# Patient Record
Sex: Female | Born: 1970 | Race: Black or African American | Hispanic: No
Health system: Southern US, Community
[De-identification: ages and names within clinical notes are randomized; demographics above are authoritative.]

## PROBLEM LIST (undated history)

## (undated) DIAGNOSIS — Z860100 Personal history of colon polyps, unspecified: Secondary | ICD-10-CM

## (undated) DIAGNOSIS — N760 Acute vaginitis: Secondary | ICD-10-CM

## (undated) DIAGNOSIS — J309 Allergic rhinitis, unspecified: Secondary | ICD-10-CM

## (undated) DIAGNOSIS — N809 Endometriosis, unspecified: Secondary | ICD-10-CM

## (undated) DIAGNOSIS — B9689 Other specified bacterial agents as the cause of diseases classified elsewhere: Secondary | ICD-10-CM

## (undated) DIAGNOSIS — I10 Essential (primary) hypertension: Secondary | ICD-10-CM

## (undated) DIAGNOSIS — M1990 Unspecified osteoarthritis, unspecified site: Secondary | ICD-10-CM

## (undated) DIAGNOSIS — K219 Gastro-esophageal reflux disease without esophagitis: Secondary | ICD-10-CM

## (undated) HISTORY — DX: Acute vaginitis: N76.0

## (undated) HISTORY — PX: EYE SURGERY: SHX253

## (undated) HISTORY — PX: CRYOTHERAPY: SHX1416

## (undated) HISTORY — DX: Endometriosis, unspecified: N80.9

## (undated) HISTORY — DX: Personal history of colon polyps, unspecified: Z86.0100

## (undated) HISTORY — PX: PELVIC LAPAROSCOPY: SHX162

## (undated) HISTORY — DX: Other specified bacterial agents as the cause of diseases classified elsewhere: B96.89

## (undated) HISTORY — DX: Personal history of colonic polyps: Z86.010

## (undated) HISTORY — DX: Allergic rhinitis, unspecified: J30.9

## (undated) HISTORY — DX: Unspecified osteoarthritis, unspecified site: M19.90

## (undated) HISTORY — DX: Gastro-esophageal reflux disease without esophagitis: K21.9

---

## 1998-09-09 ENCOUNTER — Other Ambulatory Visit: Admission: RE | Admit: 1998-09-09 | Discharge: 1998-09-09 | Payer: Self-pay | Admitting: Obstetrics and Gynecology

## 1999-04-08 ENCOUNTER — Other Ambulatory Visit: Admission: RE | Admit: 1999-04-08 | Discharge: 1999-04-08 | Payer: Self-pay | Admitting: *Deleted

## 1999-08-01 ENCOUNTER — Ambulatory Visit (HOSPITAL_COMMUNITY): Admission: RE | Admit: 1999-08-01 | Discharge: 1999-08-01 | Payer: Self-pay | Admitting: Obstetrics and Gynecology

## 1999-08-01 ENCOUNTER — Encounter: Payer: Self-pay | Admitting: Obstetrics and Gynecology

## 1999-11-02 ENCOUNTER — Inpatient Hospital Stay (HOSPITAL_COMMUNITY): Admission: AD | Admit: 1999-11-02 | Discharge: 1999-11-05 | Payer: Self-pay | Admitting: Obstetrics and Gynecology

## 2000-04-16 ENCOUNTER — Other Ambulatory Visit: Admission: RE | Admit: 2000-04-16 | Discharge: 2000-04-16 | Payer: Self-pay | Admitting: Obstetrics and Gynecology

## 2001-05-19 ENCOUNTER — Emergency Department (HOSPITAL_COMMUNITY): Admission: EM | Admit: 2001-05-19 | Discharge: 2001-05-19 | Payer: Self-pay | Admitting: Emergency Medicine

## 2001-05-19 ENCOUNTER — Encounter: Payer: Self-pay | Admitting: Emergency Medicine

## 2001-06-05 ENCOUNTER — Encounter: Admission: RE | Admit: 2001-06-05 | Discharge: 2001-06-20 | Payer: Self-pay | Admitting: Family Medicine

## 2002-03-27 ENCOUNTER — Ambulatory Visit (HOSPITAL_COMMUNITY): Admission: RE | Admit: 2002-03-27 | Discharge: 2002-03-27 | Payer: Self-pay | Admitting: Gastroenterology

## 2002-03-27 ENCOUNTER — Encounter: Payer: Self-pay | Admitting: Gastroenterology

## 2002-05-08 ENCOUNTER — Encounter: Payer: Self-pay | Admitting: Emergency Medicine

## 2002-05-08 ENCOUNTER — Emergency Department (HOSPITAL_COMMUNITY): Admission: EM | Admit: 2002-05-08 | Discharge: 2002-05-08 | Payer: Self-pay | Admitting: Emergency Medicine

## 2002-06-10 ENCOUNTER — Other Ambulatory Visit: Admission: RE | Admit: 2002-06-10 | Discharge: 2002-06-10 | Payer: Self-pay | Admitting: Obstetrics and Gynecology

## 2002-09-12 ENCOUNTER — Inpatient Hospital Stay (HOSPITAL_COMMUNITY): Admission: RE | Admit: 2002-09-12 | Discharge: 2002-09-14 | Payer: Self-pay | Admitting: Obstetrics and Gynecology

## 2002-09-12 ENCOUNTER — Encounter (INDEPENDENT_AMBULATORY_CARE_PROVIDER_SITE_OTHER): Payer: Self-pay

## 2003-06-02 ENCOUNTER — Other Ambulatory Visit: Admission: RE | Admit: 2003-06-02 | Discharge: 2003-06-02 | Payer: Self-pay | Admitting: Obstetrics and Gynecology

## 2004-03-06 ENCOUNTER — Emergency Department (HOSPITAL_COMMUNITY): Admission: EM | Admit: 2004-03-06 | Discharge: 2004-03-06 | Payer: Self-pay | Admitting: Emergency Medicine

## 2004-05-17 ENCOUNTER — Ambulatory Visit (HOSPITAL_COMMUNITY): Admission: RE | Admit: 2004-05-17 | Discharge: 2004-05-17 | Payer: Self-pay | Admitting: Obstetrics and Gynecology

## 2004-07-01 ENCOUNTER — Other Ambulatory Visit: Admission: RE | Admit: 2004-07-01 | Discharge: 2004-07-01 | Payer: Self-pay | Admitting: Obstetrics and Gynecology

## 2004-09-09 ENCOUNTER — Ambulatory Visit: Payer: Self-pay | Admitting: Family Medicine

## 2005-07-06 ENCOUNTER — Other Ambulatory Visit: Admission: RE | Admit: 2005-07-06 | Discharge: 2005-07-06 | Payer: Self-pay | Admitting: Obstetrics and Gynecology

## 2005-08-10 ENCOUNTER — Encounter: Admission: RE | Admit: 2005-08-10 | Discharge: 2005-08-10 | Payer: Self-pay | Admitting: Obstetrics and Gynecology

## 2005-08-26 ENCOUNTER — Encounter: Admission: RE | Admit: 2005-08-26 | Discharge: 2005-08-26 | Payer: Self-pay | Admitting: Obstetrics and Gynecology

## 2006-05-29 ENCOUNTER — Ambulatory Visit: Payer: Self-pay | Admitting: Family Medicine

## 2006-07-18 ENCOUNTER — Other Ambulatory Visit: Admission: RE | Admit: 2006-07-18 | Discharge: 2006-07-18 | Payer: Self-pay | Admitting: Obstetrics and Gynecology

## 2007-01-14 ENCOUNTER — Ambulatory Visit: Payer: Self-pay | Admitting: Internal Medicine

## 2007-01-15 ENCOUNTER — Encounter: Payer: Self-pay | Admitting: Internal Medicine

## 2007-01-19 ENCOUNTER — Ambulatory Visit: Payer: Self-pay | Admitting: Family Medicine

## 2007-07-31 LAB — HM COLONOSCOPY

## 2007-08-20 ENCOUNTER — Ambulatory Visit: Payer: Self-pay | Admitting: Internal Medicine

## 2007-08-20 DIAGNOSIS — J309 Allergic rhinitis, unspecified: Secondary | ICD-10-CM | POA: Insufficient documentation

## 2007-08-20 DIAGNOSIS — Z860100 Personal history of colon polyps, unspecified: Secondary | ICD-10-CM | POA: Insufficient documentation

## 2007-08-20 DIAGNOSIS — N808 Other endometriosis: Secondary | ICD-10-CM | POA: Insufficient documentation

## 2007-08-20 DIAGNOSIS — K219 Gastro-esophageal reflux disease without esophagitis: Secondary | ICD-10-CM | POA: Insufficient documentation

## 2007-08-20 LAB — CONVERTED CEMR LAB: Glucose, Bld: 89 mg/dL

## 2007-09-05 ENCOUNTER — Ambulatory Visit: Payer: Self-pay | Admitting: Gastroenterology

## 2007-09-18 ENCOUNTER — Ambulatory Visit: Payer: Self-pay | Admitting: Gastroenterology

## 2007-09-24 ENCOUNTER — Encounter: Payer: Self-pay | Admitting: Internal Medicine

## 2007-11-05 ENCOUNTER — Ambulatory Visit: Payer: Self-pay | Admitting: Internal Medicine

## 2008-02-29 ENCOUNTER — Ambulatory Visit: Payer: Self-pay | Admitting: Internal Medicine

## 2008-02-29 LAB — CONVERTED CEMR LAB: Rapid Strep: POSITIVE

## 2008-08-10 ENCOUNTER — Telehealth (INDEPENDENT_AMBULATORY_CARE_PROVIDER_SITE_OTHER): Payer: Self-pay | Admitting: *Deleted

## 2008-09-22 ENCOUNTER — Ambulatory Visit: Payer: Self-pay | Admitting: Internal Medicine

## 2008-12-15 ENCOUNTER — Ambulatory Visit: Payer: Self-pay | Admitting: Internal Medicine

## 2009-08-11 ENCOUNTER — Telehealth (INDEPENDENT_AMBULATORY_CARE_PROVIDER_SITE_OTHER): Payer: Self-pay | Admitting: *Deleted

## 2009-11-11 ENCOUNTER — Ambulatory Visit: Payer: Self-pay | Admitting: Internal Medicine

## 2010-08-16 ENCOUNTER — Ambulatory Visit: Payer: Self-pay | Admitting: Internal Medicine

## 2010-08-16 DIAGNOSIS — S5000XA Contusion of unspecified elbow, initial encounter: Secondary | ICD-10-CM | POA: Insufficient documentation

## 2010-08-16 DIAGNOSIS — M542 Cervicalgia: Secondary | ICD-10-CM | POA: Insufficient documentation

## 2010-11-20 ENCOUNTER — Encounter: Payer: Self-pay | Admitting: Obstetrics and Gynecology

## 2010-12-01 NOTE — Assessment & Plan Note (Signed)
Summary: rov/swh   Vital Signs:  Patient profile:   40 year old female Height:      64 inches Weight:      153.13 pounds BMI:     26.38 Pulse rate:   70 / minute BP sitting:   140 / 80  Vitals Entered By: Kandice Hams (November 11, 2009 4:34 PM) CC: refill on meds   History of Present Illness: RF protonix  GERD symptoms agravated temporarily by a URI but she is back to normal now she noted  that her GERD  symptom is mostly cough rather than classic heartburn  Allergies: No Known Drug Allergies  Past History:  Past Medical History: Reviewed history from 09/22/2008 and no changes required. G1 P1 Colonic polyps, hx of GERD Allergic rhinitis Cscope 11-08 tics, repeat in 10 years patient would like to a cscope sooner, will discuss on each CPX)  Past Surgical History: Reviewed history from 11/05/2007 and no changes required. Surgery for endometriosis (2003) Eye surgery PRK Bilat  Family History: breast ca--no DM--gm? HTN-- F and M colon ca? GM  (Dx age 23s to 48s)  Social History: Reviewed history from 08/20/2007 and no changes required. 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 GI:  Denies bloody stools, nausea, and vomiting; no dysphagia .  Physical Exam  General:  alert, well-developed, and well-nourished.   Lungs:  normal respiratory effort, no intercostal retractions, no accessory muscle use, and normal breath sounds.   Heart:  normal rate, regular rhythm, and no murmur.   Abdomen:  soft, non-tender, and no distention.     Impression & Recommendations:  Problem # 1:  GERD (ICD-530.81) RF  medications doing okay she noted  that her GERD  symptom is mostly cough rather than classic heartburn  Her updated medication list for this problem includes:    Protonix 40 Mg Tbec (Pantoprazole sodium) .Marland Kitchen... 1 by mouth before breakfast  Problem # 2:  ROUTINE GENERAL  MEDICAL EXAM@HEALTH  CARE FACL (ICD-V70.0) see gyn wonders when her next colonoscopy should be.  She is unsure in regards to her family history, if her grandmother had indeed  colon cancer it was in her 72s or 14s.  For now I think is reasonable to do her next colonoscopy in 2018  Complete Medication List: 1)  Protonix 40 Mg Tbec (Pantoprazole sodium) .Marland Kitchen.. 1 by mouth before breakfast 2)  Otc Zyrtec  .... Qd 3)  Quasense 0.15-0.03 Mg Tabs (Levonorgest-eth estrad 91-day) 4)  Melatonin  .Marland KitchenMarland KitchenMarland Kitchen 1po qd 5)  Mvt  .Marland KitchenMarland KitchenMarland Kitchen 1po qd 6)  Ibuprofen  .... Prn Prescriptions: PROTONIX 40 MG  TBEC (PANTOPRAZOLE SODIUM) 1 by mouth before breakfast  #90 x 4   Entered and Authorized by:   Nolon Rod. Ryoma Nofziger MD   Signed by:   Nolon Rod. Timya Trimmer MD on 11/11/2009   Method used:   Print then Give to Patient   RxID:   [id number]

## 2010-12-01 NOTE — Assessment & Plan Note (Signed)
Summary: TO DISCUSS HEADACHE//PH   Vital Signs:  Patient profile:   40 year old female Height:      64 inches Weight:      154.50 pounds BMI:     26.62 Pulse rate:   70 / minute Pulse rhythm:   regular BP sitting:   128 / 80  (left arm) Cuff size:   large  Vitals Entered By: Army Fossa CMA (August 16, 2010 4:19 PM) CC: Pt here c/o pain from (R) arm up to neck and head.  Comments x 3-4 weeks  Head pain not light or sound sensitive.    History of Present Illness: 4 weeks ago, she bumped her right elbow and developed pain, no swelling. Pain is better except when she tries to hyperextend her elbow.  Also complaining of pain in the neck, described as a pulling feeling from the right side of the neck base going up to the right side of the head. pain is somehow worse with neck movement. She had a headache last week but that is resolved Ibuprofen helps  Review of systems No rash in the face and neck No upper extremity paresthesias No injury in the neck  Current Medications (verified): 1)  Protonix 40 Mg  Tbec (Pantoprazole Sodium) .Marland Kitchen.. 1 By Mouth Before Breakfast 2)  Otc Zyrtec .... Qd 3)  Quasense 0.15-0.03 Mg  Tabs (Levonorgest-Eth Estrad 91-Day) 4)  Melatonin .Marland KitchenMarland KitchenMarland Kitchen 1po Qd 5)  Mvt .Marland KitchenMarland KitchenMarland Kitchen 1po Qd 6)  Ibuprofen .... Prn  Allergies (verified): No Known Drug Allergies  Past History:  Past Medical History: G1 P1 Colonic polyps, hx of GERD Allergic rhinitis Cscope 11-08 tics, repeat in 10 years patient would like to a cscope sooner, will discuss on each CPX   Past Surgical History: Reviewed history from 11/05/2007 and no changes required. Surgery for endometriosis (2003) Eye surgery PRK Bilat  Social History: Reviewed history from 11/11/2009 and no changes required. 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   Neck:  full range of motion, nontender to palpation at the cervical spine,  slightly tender at the base of the neck (right side) Msk:  inspection on palpation of the shoulders and elbows normal Extremities:  no lower extremity edema Neurologic:  alert & oriented X3, strength normal in all extremities, gait normal, and DTRs symmetrical and normal.   Psych:  not anxious appearing and not depressed appearing.     Impression & Recommendations:  Problem # 1:  NECK PAIN (ICD-723.1) symptoms as described in the history of present illness likely related to neck sprain Will recommend conservative treatment at this point Ibuprofen helps ----> see instructions  she tried a muscle relaxant from her husband and they make her very sleepy, I still think she would benefit from  a lower dose Trial with Flexeril 5 mg to be taken early in the night to avoid lingering sometimes  Her updated medication list for this problem includes:    Cyclobenzaprine Hcl 5 Mg Tabs (Cyclobenzaprine hcl) ..... One by mouth at night  Problem # 2:  CONTUSION, ELBOW (ICD-923.11) seems  to be improving except for mild pain with hyperextension Physical exam essentially negative x-ray? Patient declined at this time but will let me know if she's not completely well in few  weeks  Complete Medication List: 1)  Protonix 40 Mg Tbec (Pantoprazole sodium) .Marland Kitchen.. 1 by mouth before breakfast 2)  Otc Zyrtec  .... Qd 3)  Quasense 0.15-0.03 Mg Tabs (Levonorgest-eth estrad 91-day) 4)  Melatonin  .Marland KitchenMarland KitchenMarland Kitchen 1po qd 5)  Mvt  .Marland KitchenMarland KitchenMarland Kitchen 1po qd 6)  Ibuprofen  .... Prn 7)  Cyclobenzaprine Hcl 5 Mg Tabs (Cyclobenzaprine hcl) .... One by mouth at night  Patient Instructions: 1)  Take   Ibuprofen OTC 200mg  : 2 tabs  with food every 6 hours as needed  for relief of pain ; watch for stomach irritation, nausea, burning. 2)  take the muscle relaxant early in the night as needed for pain 3)  Call if no better in a few days,  call any time if you feel  worse Prescriptions: CYCLOBENZAPRINE HCL 5 MG TABS (CYCLOBENZAPRINE HCL) one by mouth  at night  #21 x 0   Entered and Authorized by:   Nolon Rod. Paz MD   Signed by:   Nolon Rod. Paz MD on 08/16/2010   Method used:   Print then Give to Patient   RxID:   [id number]    Orders Added: 1)  Est. Patient Level III [64403]

## 2010-12-21 ENCOUNTER — Telehealth (INDEPENDENT_AMBULATORY_CARE_PROVIDER_SITE_OTHER): Payer: Self-pay | Admitting: *Deleted

## 2010-12-27 NOTE — Progress Notes (Signed)
Summary: refill  Phone Note Refill Request Message from:  Pharmacy on December 21, 2010 8:39 AM  Refills Requested: Medication #1:  PROTONIX 40 MG  TBEC 1 by mouth before breakfast [facility] DELIVERY- [id number]  Initial call taken by: Okey Regal [place],  December 21, 2010 8:43 AM    Prescriptions: PROTONIX 40 MG  TBEC (PANTOPRAZOLE SODIUM) 1 by mouth before breakfast  #90 x 2   Entered by:   Army Fossa CMA   Authorized by:   Nolon Rod. Paz MD   Signed by:   Army Fossa CMA on 12/21/2010   Method used:   Faxed to ...       [address] [facility] (mail-order)       [address]       [place], [place]  [address]       Ph: [phone]       Fax: [phone]   RxID:   [id number]

## 2011-02-17 ENCOUNTER — Other Ambulatory Visit: Payer: Self-pay | Admitting: Obstetrics and Gynecology

## 2011-02-17 DIAGNOSIS — Z1231 Encounter for screening mammogram for malignant neoplasm of breast: Secondary | ICD-10-CM

## 2011-03-17 NOTE — Op Note (Signed)
[facility]  Patient:    Abigail Mathis              MRN: [id number] Proc. Date: 11/02/99 Adm. Date:  [id number] Attending:  Dierdre Forth Pearline                           Operative Report  PREOPERATIVE DIAGNOSIS:       [redacted] weeks gestation.  Nonreassuring fetal heart rate tracing.  Failure to progress in labor.  POSTOPERATIVE DIAGNOSIS:      [redacted] weeks gestation.  Nonreassuring fetal heart rate tracing.  Failure to progress in labor.  Fibroid uterus.  OPERATION:                    Primary low transverse cesarean section.  SURGEON:                      Janine Limbo, M.D.  ASSISTANT:                    Miguel Dibble, C.N.M.  ANESTHESIA:                   Epidural.  ESTIMATED BLOOD LOSS:  INDICATIONS:                  The patient is a 40 year old female, gravida 1, para 0, who presents at [redacted] weeks gestation.  Pitocin was given to augment her labor after rupture of membranes.  The patient dilated her cervix to 4 cm, but then progressed no further after four hours of Pitocin augmentation.  The patient began having late decellerations and her Pitocin was decreased.  The Pitocin was again increased and late decellerations returned.  The patient understands the indications for her procedure and she accepts the risks of, but not limited to,  anesthetic complications, bleeding, infections, and possible damage to the surrounding organs.  FINDINGS:                     A 7 pound 1 ounce female infant Gerri Spore) was delivered from the cephalic presentation.  The Apgars were 8 at one minute and 9 at five minutes.  The infant was in a left occipitoposterior position.  There was a nuchal cord present.  The fallopian tubes and ovaries were normal.  The uterus was normal except there were two 1 cm fibroids present on the fundus of the uterus.  DESCRIPTION OF PROCEDURE:     The patient was taken to the operating room where it was  determined that the epidural she had received for labor would be adequate for cesarean delivery.  A Foley catheter had previously been placed.  The patients abdomen was prepped with multiple layers of Betadine and then sterilely draped. A low transverse incision was made in the abdomen and carried sharply through the  subcutaneous tissue, the fascia, and the anterior peritoneum.  An incision was ade in the lower uterine segment and extended transversely.  The fetal head was delivered.  The mouth and nose were suctioned.  The remainder of the infant was  delivered.  Routine cord blood studies were obtained.  The placenta was removed. The uterine cavity was cleaned of amniotic fluid, clotted blood, and membranes.  The uterine incision was closed using a running locking suture of 2-0 Vicryl. Hemostasis was achieved using several figure-of-eight sutures of 2-0 Vicryl and  also several figure-of-eight  sutures of 0 Vicryl.  The peritoneal cavity was vigorously irrigated.  The anterior peritoneum and the abdominal musculature were reapproximated in the midline using interrupted sutures of 0 Vicryl.  The subcutaneous tissue and the fascia were irrigated.  Hemostasis was adequate. The fascia was closed using a running suture of 0 Vicryl followed by three interrupted sutures of 0 Vicryl.  The subcutaneous tissue was closed using a running suture of 0 Vicryl.  The skin was reapproximated using skin staples.  Sponge, needle, and  instrument counts were correct x 2 occasions.  The estimated blood loss for the  procedure was 500 cc.  The patient tolerated her procedure well.  She was taken to the recovery room in stable condition.  The infant was taken to the fullterm nursery in stable condition. DD:  11/02/99 TD:  11/03/99 Job: [id number]

## 2011-03-17 NOTE — H&P (Signed)
NAME:  Abigail Mathis, Abigail Mathis                ACCOUNT NO.:  [id number]   MEDICAL RECORD NO.:  [id number]                   PATIENT TYPE:  AMB   LOCATION:                                       FACILITY:  [facility]   PHYSICIAN:  Naima A. Dillard, M.D.              DATE OF BIRTH:  11/29/1970   DATE OF ADMISSION:  DATE OF DISCHARGE:                                HISTORY & PHYSICAL   CHIEF COMPLAINT:  Dysmenorrhea, rectal pain and constipation.   HISTORY OF PRESENT ILLNESS:  The patient is a 40 year old gravida 1, para 1  whose last menstrual period was 08/12/2002, who presented to the emergency  room on May 08, 2002, complaining of severe rectal pain and constipation.  The patient was at that time found to have a posterior cul-de-sac mass  measuring between 2.5 and 3 cm on CT scan and an MRI and also some prominent  vaginal rugae-appearing vaginal mucosa in the fornix.  The patient did have  a colonoscopy around May 2003 which was significant for a benign polyp.  She  was diagnosed with irritable bowel syndrome and was placed on Zelnorm which  does give her some relief.  The patient denies having any melena or  hematochezia in her stool.  She denied any fevers, chills, or urinary tract  infection symptoms.  She still suffers from constipation but does have some  bowel movements when she bulks up her diet with fiber and increases her  water.  The patient at the time also had some vaginal spotting while taking  birth control pills; however, she has stopped taking birth control pills and  uses a condom for contraception since that time.  She has had normal cycles  with no spotting in between.  She does, however, have dysmenorrhea with her  cycles which is usually relieved with Motrin.   PAST MEDICAL HISTORY:  Significant for:  1. Irritable bowel syndrome.  2. GERD.   PAST OBSTETRICAL HISTORY:  She had a primary low transverse cesarean section  secondary to failure to progress without any  complications.   PAST FAMILY PSYCHIATRIC HISTORY:  She had menarche that started in her  teens.  She cannot remember how old.  Her menses comes every month lasting  for four to five days.  She no longer has spotting since stopping the OCT in  between her cycles.  She does have some pain with her cycles, and she has no  history of sexually-transmitted diseases.  She had an abnormal Pap smear for  which she had cryosurgery in 1992.  Her last Pap smear was normal in August  2003.   PAST SURGICAL HISTORY:  Significant for a C-section x 1 and cryosurgery in  1992.   MEDICATIONS:  Zelnorm and Prevacid.   ALLERGIES:  The patient has no known drug allergies.  She does have seasonal  allergies.   FAMILY HISTORY:  Significant for maternal grandmother with diabetes,  maternal grandmother  with either cervical or ovarian cancer and possible  colon cancer.  Both of her parents have hypertension.   REVIEW OF SYSTEMS:  GENITOURINARY:  She has dysmenorrhea.  GI:  Symptoms are  as above.  NEUROLOGICAL:  She has no weakness or musculoskeletal  abnormalities.  ENDOCRINE:  Symptoms are within normal limits.  She has no  cardiovascular or respiratory symptoms.  She has no peripheral vascular  disease.   PERTINENT LABORATORY AND X-RAY DATA:  The biopsy from the prominent vaginal  mucosa came back as endometriosis.  Her Pap smear was normal.  On her CT  scan, it showed a 2.5-cm soft tissue mass which was __________ between the  uterus and rectum, approximately located about 7-8 cm superior to the  external sphincter.  On MRI, another 3-cm soft tissue mass was __________  between the posterior cervix and rectum.  We were not able to see the origin  of the mass.   PHYSICAL EXAMINATION:  VITAL SIGNS:  The patient weighs 171 pounds.  Her  blood pressure is 122/68.  GENERAL:  She is a well-developed, well-nourished female in no apparent  distress.  HEENT:  Her head is normocephalic and atraumatic.  Her  nares are clear.  NECK:  Supple with free range of motion.  She had no thyroid enlargement,  masses, or clavicular adenopathy.  HEART:  Regular rate and rhythm.  LUNGS:  Clear to auscultation bilaterally.  ABDOMEN:  Nondistended, soft, with good bowel sounds.  There is no rebound,  tenderness, and no guarding.  VULVOVAGINAL:  The vulva exam was within normal limits.  She has a normal-  appearing cervix.  There is prominent rugae in the posterior fornix but no  ulcerations or prominent mass noted.  You could feel a mass in the posterior  fornix, and she did have some mild tenderness in that area.  Her uterus was  about 8 week size and nontender.  There are no adnexal masses.  EXTREMITIES:  No cyanosis, clubbing, or edema.   ASSESSMENT:  Posterior cul-de-sac mass, rectal pain, constipation,  dysmenorrhea.   DIFFERENTIAL DIAGNOSES:  Differential diagnoses for the mass is:  1. Endometrioma.  2. Posterior cervical polyp.  3. Possible cancer.   PLAN:  The patient was given the option to have Lupron and just follow the  mass and see if her symptoms improve or a diagnostic laparoscopy, possible  laparotomy and removal of the mass.  She decided for removal of the mass.  She was then sent to Dr. Lurene Shadow for consultation who said that he would be  available to help me with removal of the mass because it was near her  rectum.  The patient has chosen to proceed with diagnostic laparoscopy,  possible laparotomy.  The plan is to proceed with diagnostic laparoscopy,  removal of cul-de-sac mass, and ablation of  endometrial implant.  The patient understands the risks to be, but not  limited to, bleeding, infection, damage to her internal organs such as  bowel, bladder, major blood vessels.  The patient was given a bowel prep and  consents to procedure.                                                 Naima A. Normand Sloop, M.D.    NAD/MEDQ  D:  09/11/2002  T:  09/11/2002  Job:  [id number]  cc:    Leonie Man, M.D.  [address]  [place]  [place] [address]  Fax: [phone]   Ulyess Mort, M.D. [facility]

## 2011-03-17 NOTE — Op Note (Signed)
NAME:  Abigail Mathis, Abigail Mathis                ACCOUNT NO.:  [id number]   MEDICAL RECORD NO.:  [id number]                   PATIENT TYPE:  AMB   LOCATION:  SDC                                  FACILITY:  [facility]   PHYSICIAN:  Naima A. Dillard, M.D.              DATE OF BIRTH:  [birth year]   DATE OF PROCEDURE:  09/12/2002  DATE OF DISCHARGE:                                 OPERATIVE REPORT   PREOPERATIVE DIAGNOSES:  1. Dysmenorrhea.  2. Endometriosis.  3. Posterior cul-de-sac lesion.   POSTOPERATIVE DIAGNOSES:  Posterior cul-de-sac endometrioma involving rectal  mucosa.   PROCEDURE:  1. Diagnostic laparoscopy.  2. Exploratory laparotomy.  3. Lysis of adhesion.  4. Perirectal biopsy.  5. Proctoscopy.   SURGEON:  Naima A. Normand Sloop, M.D., Leonie Man, M.D.   ANESTHESIA:  General endotracheal tube.   ESTIMATED BLOOD LOSS:  400 cc.   IV FLUIDS:  2600 cc crystalloid.   URINE OUTPUT:  200 cc clear urine.   COMPLICATIONS:  None.   FINDINGS:  Normal size and appearing uterus.  There was bilateral sigmoid  posterior cul-de-sac and ovarian adhesions.  There was a hard mass involving  the rectal mucosa which was found to be endometriosis on biopsy.  Normal  appearing abdominal anatomy.  Normal appearing liver.   DISPOSITION:  The patient went to recovery room in stable condition.   PROCEDURE IN DETAIL:  The patient was taken to the operating room where she  was placed in the dorsal lithotomy position and prepped and draped in a  normal sterile fashion.  A bivalve speculum was placed into the vagina and  the anterior lip of the cervix was grasped with a single tooth tenaculum.  Uterus was then sounded to 7 cm with the sound and Hulka tenaculum was  placed into the uterus.  The single tooth tenaculum was removed and the  Hulka tenaculum was used to manipulate the uterus.  5 cc 0.25% Marcaine was  placed  underneath the umbilicus.  An infraumbilical incision 10 mm was then  made  with the knife and the Veress needle was placed at a 45 degree angle  while tenting the abdominal wall.  Intra-abdominal placement was confirmed  with fluid filled syringe and decrease in CO2 pressure.  The abdomen was  insufflated with about 3 L CO2 gas.  The 10 mm trocar was then placed and  confirmed intra-abdominal placement with the laparoscope.  The findings  noted above were seen.  A 5 mm trocar was placed 2 cm above the symphysis  pubis at her previous incision and in both right and left lower quadrants.  After being infiltrated with 3 cc Marcaine 5-mm trocars were placed under  direct visualization without difficulty.  Lysis of adhesions was performed  both sharply with Metzenbaum scissors and bluntly from the right ovary and  mesosalpinx to the posterior cul-de-sac.  The sigmoid colon was also  dissected meticulously and the right ovary was dissected  in a similar  fashion.  At this point we felt that the mass was very difficult and hard  and we knew that to try to remove as much mass as we could we would have to  proceed with exploratory laparotomy.  Decision was then made to remove all  trocars and a Pfannenstiel skin incision was then made in her previous where  she had a cesarean section with the knife and carried down to the fascia  using Bovie.  The fascia was incised in the midline and extended bilaterally  using Bovie cautery.  Kochers x2 were placed on the superior aspect of the  fascia which was dissected off the rectus muscle sharply using both cautery  in the inferior aspect of the fascia was elevated off the rectus muscle in a  similar fashion.  The anterior rectus muscles were separated in midline.  Peritoneum identified, tented up, entered sharply with Metzenbaum scissors.  Peritoneal incision was extended superiorly and inferiorly and with good  visualization of bowel and bladder a Balfour retractor was then placed.  The  bowel was packed away with moist laparotomy  sponges.  The rectum was further  dissected off of the mass as much as possible.  At the time the mass could  not be removed we decided to send the biopsy of the perirectal area of the  mass to frozen section.  It was found to be endometriosis.  To remove the  entire mass Dr. Lurene Shadow felt that the patient would have to have rectum  resection with a colectomy.  Because the patient stated that she did not  want a colectomy before the procedure, we decided to stop further dissection  because it was benign and discuss this with the patient when she was awake.  We will try to give Lupron for treatment.  The area was made hemostatic.  There was some bleeding from the patient's mesosalpinx and from the left  ovary which was made hemostatic using 3-0 Vicryl in interrupted stitches.  Hemostasis was assured.  There was some oozing along the cul-de-sac where we  did our dissection so Gelfoam was placed.  INTERCEED was also placed in this  area just in case the patient does have to go back for a rectal resection  and colectomy.  Sponge, lap, and needle counts were correct.  All  instruments were removed from the abdomen.  Again, sponge, lap, and needle  counts were correct.  Any bleeding areas were made hemostatic with Bovie  after irrigation before the instruments were removed from the abdomen.  Any  bleeding areas on the rectus muscle or fascia were made hemostatic.  The  fascia was closed with 0 Vicryl in a running fashion.  The subcutaneous  tissue was irrigated and made hemostatic with Bovie cautery.  Skin was  closed with staples.  The umbilical incision and other small incisions were  closed with 3-0 Vicryl in a subcuticular fashion.  Sponge, lap, and needle  counts were correct x2.  Before we had closed, Dr. Lurene Shadow did a proctoscopy  which he will dictate to make sure that we did not compromise the rectum. We put saline into the cul-de-sac __________ of the proctoscopy.  No air was  seen.   __________ of the rectum was not compromised.  The patient understood  the procedure risks to be bleeding, infection, damage to internal organs  such as bowel, bladder, major blood vessels, and ureters.  Naima A. Normand Sloop, M.D.    NAD/MEDQ  D:  09/12/2002  T:  09/12/2002  Job:  [id number]

## 2011-03-17 NOTE — Discharge Summary (Signed)
   NAME:  Abigail Mathis, Abigail Mathis                ACCOUNT NO.:  [id number]   MEDICAL RECORD NO.:  [id number]                   PATIENT TYPE:  INP   LOCATION:  9325                                 FACILITY:  [facility]   PHYSICIAN:  Abigail Mathis, M.D.              DATE OF BIRTH:  17-Dec-1970   DATE OF ADMISSION:  09/12/2002  DATE OF DISCHARGE:  09/14/2002                                 DISCHARGE SUMMARY   ADMISSION DIAGNOSES:  1. Dysmenorrhea.  2. Endometriosis.  3. Posterior cul-de-sac mass.   DISCHARGE DIAGNOSES:  1. Dysmenorrhea.  2. 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   PROCEDURE:  The patient had a diagnostic laparoscopy, exploratory  laparotomy, lysis of adhesions, perirectal biopsy.   DISCHARGE MEDICATIONS:  Motrin, Colace and Percocet.   DISCHARGE INSTRUCTIONS:  The pelvic is to go home on pelvic rest.  She  received Lupron for the endometrioma and symptoms.   HOSPITAL COURSE:  The patient, on September 12, 2002, underwent a diagnostic  laparoscopy, exploratory laparotomy, and lysis of adhesions and perirectal  biopsy for endometrioma which was involving the rectum.  We were unable to  remove the endometrioma in its entirety secondary for it involving the  rectum; we would have to remove the rectum and give the patient a colostomy.  The patient remained stable through her hospital stay.  She was afebrile  with stable vital signs.  On postoperative day #2, she began to have flatus  and tolerate a regular diet.  Her abdomen is soft and nontender.  She has  minimal vaginal bleeding.  The patient decided to get Lupron before she was  discharged from the hospital.  She had stopped her menses on Friday,  September 12, 2002.  The patient understands that her Lupron is not a birth  control and that she will use a backup method once she resumes intercourse.  Discharge instructions were given to the patient in detail.   LABORATORY DATA:   Pertinent laboratory data on postoperative #2, her white  count was 11.6.  On postoperative day #0, it was 8.2.  Her hemoglobin was  14.8, preoperatively 11.7.  Her platelets were stable.  Chem-7 was within  normal limits.  The patient has benefited from her hospital stay and will be  discharged home.  She is to follow up on Friday, September 17, 2002, for  staple removal.                                                Abigail Mathis, M.D.    NAD/MEDQ  D:  09/14/2002  T:  09/14/2002  Job:  [id number]
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
## 2011-03-17 NOTE — H&P (Signed)
[place]  Patient:    Abigail Mathis, Abigail Mathis Visit Number: [id number] MRN: [id number]          Service Type: EMS Location: ED Attending Physician:  Sandi Raveling Dictated by:   Pierre Bali Normand Sloop, M.D. Admit Date:  05/08/2002   CC:         Ulyess Mort, M.D. [facility]   History and Physical  HISTORY OF PRESENT ILLNESS:  The patient is a 40 year old gravida 1, para 1 whose last menstrual period was April 30, 2002.  The patient presented to the ER complaining of severe rectal pain.  The patient has been having constipation and diarrhea for many years, but recently has become worse.  The patient was diagnosed with irritable bowel syndrome and takes Zelnorm with some relief. The patient had a colonoscopy one to two months ago, which was significant for benign polyp.  She does have some nausea; no vomiting.  She does have a good appetite.  She has brown stool; no melena or hematochezia, no fevers or chills, no UTI symptoms.  The patient states that she has been spotting despite taking her birth control pills, and not missing any pills for the last few months.  Also has severe pain with her cycles.  PAST MEDICAL HISTORY: 1. Irritable bowel syndrome. 2. GERD.  PAST OB HISTORY:  She had a primary low transverse cesarean section secondary to failure to progress, without any complications.  GYN HISTORY:  She is menarche in her teens.  ________ month, lasting 4-5 days.  She does have some spotting in the last couple of months, despite not missing any OCPs.  She has dysmenorrhea with her cycles, which is usually controlled with Motrin.  She has no history of any sexually transmitted diseases.  She has had a history of abnormal Pap smear, and she had cryosurgery in 1992.  Her last Pap was normal.  PAST SURGICAL HISTORY:  Cesarean section x1.  MEDICATIONS:  Yasmine, Zelnorm, Prevacid.  ALLERGIES:  No known drug allergies.  She does have  seasonal allergies.  FAMILY HISTORY:  Significant for maternal grandmother with diabetes.  Maternal grandmother with either cervical or ovarian CA, and possible colon CA.  Both parents have hypertension.  REVIEW OF SYSTEMS:  GENITOURINARY:  Dysmenorrhea.  Metrorrhagia. GI: Symptoms as above.  NEUROLOGIC:  None significant.  ENDOCRINE:  None significant.  CARDIOVASCULAR:  None significant.  RESPIRATORY:  None significant.  VASCULAR:  No peripheral vascular disease.  PHYSICAL EXAMINATION:  VITAL SIGNS:  She is afebrile with a temperature of 98.5.  Blood pressure 121/55, pulse 73, respiratory rate 18.  GENERAL:  The patient is in no apparent distress.  HEENT:  Head -- Normocephalic, atraumatic.  Nares -- Clear.  NECK:  Supple.  Free range of motion.  She has no thyroid enlargement or masses; no clavicular adenopathy.  HEART:  Regular rate and rhythm.  LUNGS:  Clear to auscultation bilaterally.  ABDOMEN:  Nondistended and soft; with good bowel sounds.  There is no rebound tenderness and no guarding.  VULVOVAGINAL:  Within normal limits.  She has a normal-appearing cervix with some bleeding from the os and old blood in the vault.  There is a hardened mass felt in the posterior fornix, and she has prominent rugae in the posterior fornix; but no ulcerations or lacerations noted.  She does also have some tenderness in that area.  Her uterus was about 8-10 weeks size, and mildly irregular; but nontender.  There are no adnexal  masses.  EXTREMITIES:  No clubbing, cyanosis or edema.  PERTINENT LABORATORY DATA:  White count 7.6, hemoglobin 13.5, platelets 280 with 78 segs.  UA significant only for 15 ketones, urine HCG negative.  CT SCAN:  Significant for a 2.5 cm mass between the rectum and uterus.  ASSESSMENT:  Rectal pain with mass on CT scan, and metrorrhagia.  PLAN:  Obtain an MRI to delineate the mass between her uterus and rectum; rule out any sign of cancer, endometrioma or  fibroid.  After review with Dr. Kearney Hard, he felt that a MRI would be more appropriate than an ultrasound.  The patient had an MRI and they felt that the mass seen was probably her ovaries, which are located in the posterior fornix; otherwise no abnormalities were seen.  Her ovaries appeared normal, there were no signs of fibroids and no signs of a bowel mass.  The patient will follow up with me in the office for a biopsy of the posterior fornix and re-evaluation and also an ultrasound to re-evaluate the posterior fornix area.  I did go over the MRI with Dr. Kearney Hard, but I will wait for the final reading from Dr. ________, who also said he did not see any abnormality or lesions from the bowel.  The patient will also follow up with Dr. Corinda Gubler for follow-up here due to the pain.  We will also get an ultrasound and a sonohistogram to evaluate the inside of the uterus secondary to her metrorrhagia.  The patient may eventually need a diagnostic laparoscopy to evaluate the reason for dysmenorrhea and can evaluate the bowel at that time. Dictated by:   Pierre Bali. Normand Sloop, M.D. Attending Physician:  Sandi Raveling DD:  05/08/02 TD:  05/08/02 Job: [id number]

## 2011-04-05 ENCOUNTER — Ambulatory Visit
Admission: RE | Admit: 2011-04-05 | Discharge: 2011-04-05 | Disposition: A | Discharge status: Home / Self Care | Payer: BC Managed Care – PPO | Source: Ambulatory Visit | Attending: Obstetrics and Gynecology | Admitting: Obstetrics and Gynecology

## 2011-04-05 DIAGNOSIS — Z1231 Encounter for screening mammogram for malignant neoplasm of breast: Secondary | ICD-10-CM

## 2011-08-17 ENCOUNTER — Encounter: Payer: Self-pay | Admitting: Emergency Medicine

## 2011-08-18 ENCOUNTER — Ambulatory Visit (INDEPENDENT_AMBULATORY_CARE_PROVIDER_SITE_OTHER): Payer: BC Managed Care – PPO | Admitting: Internal Medicine

## 2011-08-18 ENCOUNTER — Encounter: Payer: Self-pay | Admitting: Internal Medicine

## 2011-08-18 VITALS — BP 122/82 | HR 69 | Temp 98.1°F | Resp 14 | Wt 154.4 lb

## 2011-08-18 DIAGNOSIS — M2550 Pain in unspecified joint: Secondary | ICD-10-CM | POA: Insufficient documentation

## 2011-08-18 DIAGNOSIS — G4700 Insomnia, unspecified: Secondary | ICD-10-CM

## 2011-08-18 DIAGNOSIS — K219 Gastro-esophageal reflux disease without esophagitis: Secondary | ICD-10-CM

## 2011-08-18 MED ORDER — PANTOPRAZOLE SODIUM 40 MG PO TBEC: 40.0000 mg | DELAYED_RELEASE_TABLET | Freq: Every day | ORAL | Status: DC

## 2011-08-18 MED ORDER — LORAZEPAM 1 MG PO TABS: 1.0000 mg | ORAL_TABLET | Freq: Every evening | ORAL | Status: AC | PRN

## 2011-08-18 MED ORDER — CYCLOBENZAPRINE HCL 5 MG PO TABS: 5.0000 mg | ORAL_TABLET | Freq: Two times a day (BID) | ORAL | Status: DC | PRN

## 2011-08-18 NOTE — Assessment & Plan Note (Signed)
Due to stress she is having a hatd time sleeping, ativan from her husband did help (dose?) Plan: D/c OTC melatonin Low dose of ativan to be taken prn

## 2011-08-18 NOTE — Progress Notes (Signed)
  Subjective:    Patient ID: Abigail Mathis, female    DOB: Apr 22, 1971, 40 y.o.   MRN: [id number]  HPI GERD, symptoms well controlled as long as she takes Protonix 40 mg daily, if she runs out she develops cough and heartburn Insomnia, currently going through a separation, has a hard time sleeping, she tried her husband's Ativan and it worked well. Over-the-counter melatonin was not helping much. From time to time has minor joint aches and she would like to have Flexeril to be taken as needed.  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 She states that she has a Veterinary surgeon available at work and will reach for help    Objective:   Physical Exam  Constitutional: She is oriented to person, place, and time. She appears well-developed and well-nourished.  HENT:  Head: Normocephalic and atraumatic.  Cardiovascular: Normal rate, regular rhythm and normal heart sounds.   No murmur heard. Pulmonary/Chest: Effort normal and breath sounds normal. No respiratory distress. She has no wheezes. She has no rales.  Musculoskeletal: She exhibits no edema.  Neurological: She is alert and oriented to person, place, and time.        Assessment & Plan:

## 2011-08-18 NOTE — Patient Instructions (Signed)
Ry protonix every other day Lorazepam 1/2 or 1 at night as needed

## 2011-08-18 NOTE — Assessment & Plan Note (Signed)
Uses OTC motrin and flexeril as needed for minor aches and pains, RF flexeril

## 2011-08-18 NOTE — Assessment & Plan Note (Signed)
Become symptomatic if run out of meds, on the other hand she is worry she has to take PPIs qd. We agreeed to find the lowest effective dose: Try either 1/2 dose or take meds qod

## 2012-04-19 ENCOUNTER — Other Ambulatory Visit: Payer: Self-pay | Admitting: Obstetrics and Gynecology

## 2012-04-19 DIAGNOSIS — Z1231 Encounter for screening mammogram for malignant neoplasm of breast: Secondary | ICD-10-CM

## 2012-05-07 ENCOUNTER — Ambulatory Visit
Admission: RE | Admit: 2012-05-07 | Discharge: 2012-05-07 | Disposition: A | Discharge status: Home / Self Care | Payer: BC Managed Care – PPO | Source: Ambulatory Visit | Attending: Obstetrics and Gynecology | Admitting: Obstetrics and Gynecology

## 2012-05-07 DIAGNOSIS — Z1231 Encounter for screening mammogram for malignant neoplasm of breast: Secondary | ICD-10-CM

## 2012-05-22 ENCOUNTER — Encounter: Payer: Self-pay | Admitting: Obstetrics and Gynecology

## 2012-08-19 ENCOUNTER — Ambulatory Visit (INDEPENDENT_AMBULATORY_CARE_PROVIDER_SITE_OTHER): Payer: BC Managed Care – PPO | Admitting: Internal Medicine

## 2012-08-19 ENCOUNTER — Encounter: Payer: Self-pay | Admitting: Internal Medicine

## 2012-08-19 VITALS — BP 144/86 | HR 77 | Temp 98.5°F | Ht 64.25 in | Wt 160.6 lb

## 2012-08-19 DIAGNOSIS — K219 Gastro-esophageal reflux disease without esophagitis: Secondary | ICD-10-CM

## 2012-08-19 DIAGNOSIS — Z0000 Encounter for general adult medical examination without abnormal findings: Secondary | ICD-10-CM

## 2012-08-19 DIAGNOSIS — G4700 Insomnia, unspecified: Secondary | ICD-10-CM

## 2012-08-19 MED ORDER — LORAZEPAM 1 MG PO TABS: 0.5000 mg | ORAL_TABLET | Freq: Every evening | ORAL | Status: DC | PRN

## 2012-08-19 MED ORDER — PANTOPRAZOLE SODIUM 40 MG PO TBEC: 40.0000 mg | DELAYED_RELEASE_TABLET | Freq: Every day | ORAL | Status: DC

## 2012-08-19 NOTE — Assessment & Plan Note (Addendum)
Tdap < 10 years, had a flu shot H/o colon polyps,  3th Cscope 11-08 tics, repeat in 10 years per patient   Labs Has a healthy lifestyle Gyn-- Dr Normand Sloop BP slightly elevated, see instructions

## 2012-08-19 NOTE — Assessment & Plan Note (Signed)
RF protonix 

## 2012-08-19 NOTE — Assessment & Plan Note (Signed)
RF lorazepam

## 2012-08-19 NOTE — Patient Instructions (Addendum)
Please come back at your earliest convenience for fasting labs: CMP, FLP, CBC, TSH -- dx V70 ----- Check the  blood pressure 2 or 3 times a week, be sure it is between 110/60 and 140/85. If it is consistently higher or lower, let me know

## 2012-08-19 NOTE — Progress Notes (Signed)
  Subjective:    Patient ID: Abigail Mathis, female    DOB: 10-Dec-1970, 41 y.o.   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 HTN-- F and M   Review of Systems No chest pain or shortness of breath No nausea, vomiting, diarrhea or blood in the stools. Occasionally feels bloated, usually related to endometriosis. GERD symptoms well controlled, needs a refill . Has a lot of stress, not clinically depressed or anxious but from time to time next a sleeping aid. Will refill her lorazepam which was prescribed for her before.     Objective:   Physical Exam  General -- alert, well-developed, and well-nourished.   Neck --no thyromegaly  Lungs -- normal respiratory effort, no intercostal retractions, no accessory muscle use, and normal breath sounds.   Heart-- normal rate, regular rhythm, no murmur, and no gallop.   Abdomen--soft, non-tender, no distention, no masses, no HSM, no guarding, and no rigidity.   Extremities-- no pretibial edema bilaterally  Neurologic-- alert & oriented X3 and strength normal in all extremities. Psych-- Cognition and judgment appear intact. Alert and cooperative with normal attention span and concentration.  not anxious appearing and not depressed appearing.      Assessment & Plan:

## 2012-08-21 ENCOUNTER — Other Ambulatory Visit (INDEPENDENT_AMBULATORY_CARE_PROVIDER_SITE_OTHER): Payer: BC Managed Care – PPO

## 2012-08-21 DIAGNOSIS — Z0000 Encounter for general adult medical examination without abnormal findings: Secondary | ICD-10-CM

## 2012-08-21 LAB — CBC WITH DIFFERENTIAL/PLATELET
Basophils Absolute: 0 10*3/uL (ref 0.0–0.1)
Eosinophils Absolute: 0.1 10*3/uL (ref 0.0–0.7)
Hemoglobin: 13.8 g/dL (ref 12.0–15.0)
Lymphocytes Relative: 25.9 % (ref 12.0–46.0)
MCHC: 32.7 g/dL (ref 30.0–36.0)
Monocytes Absolute: 0.5 10*3/uL (ref 0.1–1.0)
Neutro Abs: 4.1 10*3/uL (ref 1.4–7.7)
RDW: 13.3 % (ref 11.5–14.6)

## 2012-08-21 LAB — LIPID PANEL
Cholesterol: 166 mg/dL (ref 0–200)
LDL Cholesterol: 83 mg/dL (ref 0–99)
Triglycerides: 67 mg/dL (ref 0.0–149.0)
VLDL: 13.4 mg/dL (ref 0.0–40.0)

## 2012-08-21 LAB — COMPREHENSIVE METABOLIC PANEL
Albumin: 3.6 g/dL (ref 3.5–5.2)
Alkaline Phosphatase: 39 U/L (ref 39–117)
BUN: 15 mg/dL (ref 6–23)
Glucose, Bld: 74 mg/dL (ref 70–99)
Total Bilirubin: 1.1 mg/dL (ref 0.3–1.2)

## 2012-08-26 ENCOUNTER — Encounter: Payer: Self-pay | Admitting: Obstetrics and Gynecology

## 2012-08-26 ENCOUNTER — Encounter: Payer: Self-pay | Admitting: *Deleted

## 2012-08-26 ENCOUNTER — Ambulatory Visit (INDEPENDENT_AMBULATORY_CARE_PROVIDER_SITE_OTHER): Payer: BC Managed Care – PPO | Admitting: Obstetrics and Gynecology

## 2012-08-26 VITALS — BP 184/140 | Ht 64.0 in | Wt 159.0 lb

## 2012-08-26 DIAGNOSIS — I10 Essential (primary) hypertension: Secondary | ICD-10-CM

## 2012-08-26 DIAGNOSIS — Z124 Encounter for screening for malignant neoplasm of cervix: Secondary | ICD-10-CM

## 2012-08-26 MED ORDER — CARVEDILOL 12.5 MG PO TABS: 12.5000 mg | ORAL_TABLET | Freq: Two times a day (BID) | ORAL | Status: DC

## 2012-08-26 MED ORDER — NORETHINDRONE 0.35 MG PO TABS: 1.0000 | ORAL_TABLET | Freq: Every day | ORAL | Status: DC

## 2012-08-26 NOTE — Progress Notes (Signed)
Last Pap: 2011 WNL: Yes Regular Periods:no Contraception: continuous bc quanese  Monthly Breast exam:yes Tetanus<62yrs:yes Nl.Bladder Function:yes Daily BMs:yes Healthy Diet:yes Calcium:yes Mammogram:yes Date of Mammogram: 2013 wnl per pt Exercise:yes Have often Exercise: 4-5 days a week Seatbelt: yes Abuse at home: no Stressful work:no Sigmoid-colonoscopy: 2011 wnl per pt Bone Density: No PCP: Dr. Drue Novel Change in PMH: no change  Change in FMH:no change  Pt BP 180/120 pt states saw PCP last week trying to control BP with diet and exercise BP 180/120  Ht 5\' 4"  (1.626 m)  Wt 159 lb (72.122 kg)  BMI 27.29 kg/m2 Pt with complaints:yes and yes she occ has headaches.  She has none today.  She denies chest pain, blurred vision, or SOB.  She said she is very stressed at work and home.  Also has a strong family h/o of CHTN Physical Examination: General appearance - alert, well appearing, and in no distress Mental status - normal mood, behavior, speech, dress, motor activity, and thought processes Neck - supple, no significant adenopathy,  thyroid exam: thyroid is normal in size without nodules or tenderness Chest - clear to auscultation, no wheezes, rales or rhonchi, symmetric air entry Heart - normal rate and regular rhythm Abdomen - soft, nontender, nondistended, no masses or organomegaly Breasts - breasts appear normal, no suspicious masses, no skin or nipple changes or axillary nodes Pelvic - normal external genitalia, vulva, vagina, cervix, uterus and adnexa Rectal - declined by the patient Back exam - full range of motion, no tenderness, palpable spasm or pain on motion Neurological - alert, oriented, normal speech, no focal findings or movement disorder noted Musculoskeletal - no joint tenderness, deformity or swelling Extremities - no edema, redness or tenderness in the calves or thighs Skin - normal coloration and turgor, no rashes, no suspicious skin lesions noted Routine  exam Endometriosis Pap sent no due next yr Mammogram due no ocp used for contraception.  Will d/c seasonale.  Pt refused to go to the ER to tx elevated BP.  Repeat BP 184/120.  I called her pcp.  He said start coreg 12.5 mg BID.  He will see her tomorrow at 11 am.  Note was given for pt to remain out of work.  seasonale was stopped and pt started on micronor RT 3 months to check blood pressure and adjustment to micronor.

## 2012-08-27 ENCOUNTER — Ambulatory Visit (INDEPENDENT_AMBULATORY_CARE_PROVIDER_SITE_OTHER): Payer: BC Managed Care – PPO | Admitting: Internal Medicine

## 2012-08-27 ENCOUNTER — Encounter: Payer: Self-pay | Admitting: *Deleted

## 2012-08-27 ENCOUNTER — Encounter: Payer: Self-pay | Admitting: Internal Medicine

## 2012-08-27 VITALS — BP 172/98 | HR 69 | Temp 97.8°F | Wt 159.0 lb

## 2012-08-27 DIAGNOSIS — I10 Essential (primary) hypertension: Secondary | ICD-10-CM | POA: Insufficient documentation

## 2012-08-27 NOTE — Patient Instructions (Addendum)
Hypertension As your heart beats, it forces blood through your arteries. This force is your blood pressure. If the pressure is too high, it is called hypertension (HTN) or high blood pressure. HTN is dangerous because you may have it and not know it. High blood pressure may mean that your heart has to work harder to pump blood. Your arteries may be narrow or stiff. The extra work puts you at risk for heart disease, stroke, and other problems.  Blood pressure consists of two numbers, a higher number over a lower, 110/72, for example. It is stated as "110 over 72." The ideal is below 120 for the top number (systolic) and under 80 for the bottom (diastolic). Write down your blood pressure today. You should pay close attention to your blood pressure if you have certain conditions such as:  Heart failure.  Prior heart attack.  Diabetes  Chronic kidney disease.  Prior stroke.  Multiple risk factors for heart disease. To see if you have HTN, your blood pressure should be measured while you are seated with your arm held at the level of the heart. It should be measured at least twice. A one-time elevated blood pressure reading (especially in the Emergency Department) does not mean that you need treatment. There may be conditions in which the blood pressure is different between your right and left arms. It is important to see your caregiver soon for a recheck. Most people have essential hypertension which means that there is not a specific cause. This type of high blood pressure may be lowered by changing lifestyle factors such as:  Stress.  Smoking.  Lack of exercise.  Excessive weight.  Drug/tobacco/alcohol use.  Eating less salt. Most people do not have symptoms from high blood pressure until it has caused damage to the body. Effective treatment can often prevent, delay or reduce that damage. TREATMENT  When a cause has been identified, treatment for high blood pressure is directed at the  cause. There are a large number of medications to treat HTN. These fall into several categories, and your caregiver will help you select the medicines that are best for you. Medications may have side effects. You should review side effects with your caregiver. If your blood pressure stays high after you have made lifestyle changes or started on medicines,   Your medication(s) may need to be changed.  Other problems may need to be addressed.  Be certain you understand your prescriptions, and know how and when to take your medicine.  Be sure to follow up with your caregiver within the time frame advised (usually within two weeks) to have your blood pressure rechecked and to review your medications.  If you are taking more than one medicine to lower your blood pressure, make sure you know how and at what times they should be taken. Taking two medicines at the same time can result in blood pressure that is too low. SEEK IMMEDIATE MEDICAL CARE IF:  You develop a severe headache, blurred or changing vision, or confusion.  You have unusual weakness or numbness, or a faint feeling.  You have severe chest or abdominal pain, vomiting, or breathing problems. MAKE SURE YOU:   Understand these instructions.  Will watch your condition.  Will get help right away if you are not doing well or get worse. Document Released: 10/16/2005 Document Revised: 01/08/2012 Document Reviewed: 06/05/2008 [facility] Patient Information 2013 [place], [place].    Check the  blood pressure 2 or 3 times a week, if it  is more150/85--->  let me know To prevent complications from hypertension including heart disease, kidney failure, strokes is important that you get your BP well control. Call with BP readings in one week Next visit 1 month

## 2012-08-27 NOTE — Progress Notes (Signed)
  Subjective:    Patient ID: Abigail Mathis, female    DOB: 02/28/1971, 41 y.o.   MRN: [id number]  HPI Acute visit Went to her gynecologist yesterday, her BP was extremely high. She had a mild headache. I saw her a few days ago, her BP was borderline elevated, she checked her BP once prior to go to the gynecologist and it was in the 170s. 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 HTN-- F and M  Review of Systems No headaches today No chest pain or shortness of breath No lower extremity edema No excessive salt intake lately She did take some Motrin last week but only 2 or 3 times.     Objective:   Physical Exam General -- alert, well-developed  Abdomen--soft, non-tender, no distention, no masses, no HSM, no guarding, and no rigidity.  No bruit Extremities-- no pretibial edema bilaterally Neurologic-- alert & oriented X3 and strength normal in all extremities. Psych-- Cognition and judgment appear intact. Alert and cooperative with normal attention span and concentration.  not anxious appearing and not depressed appearing.      Assessment & Plan:  Today , I spent more than 25 min with the patient, >50% of the time counseling, discussing the dx of HTN, risk, treatment, etc. See a/p

## 2012-08-27 NOTE — Assessment & Plan Note (Addendum)
BP was elevated a few days ago, BP yesterday at her gynecologist was extremely high. She started tcarvedilol12.5 mg bid, took one dose last night and one today. BP still elevated , I recommend to add amlodipine, risks of elevated BP discussed with the patient She is quite reluctant to take BP meds, ideally she  would like to take one pill a day, I explained the patient that I'm not sure if she would be able to get to goal with a single agent. We eventually agreed to take carvedilol twice  a day (once a day version $$), call w/  BP readings in one week.  Consider a combination of a beta blocker and a diuretic (atenolo HCT) instead.

## 2012-08-28 ENCOUNTER — Encounter (HOSPITAL_COMMUNITY): Payer: Self-pay | Admitting: *Deleted

## 2012-08-28 ENCOUNTER — Encounter: Payer: Self-pay | Admitting: Internal Medicine

## 2012-08-28 ENCOUNTER — Ambulatory Visit (INDEPENDENT_AMBULATORY_CARE_PROVIDER_SITE_OTHER): Payer: BC Managed Care – PPO | Admitting: Internal Medicine

## 2012-08-28 ENCOUNTER — Emergency Department (HOSPITAL_COMMUNITY)
Admission: EM | Admit: 2012-08-28 | Discharge: 2012-08-28 | Disposition: A | Discharge status: Home / Self Care | Payer: BC Managed Care – PPO | Attending: Emergency Medicine | Admitting: Emergency Medicine

## 2012-08-28 VITALS — BP 170/110 | HR 61 | Temp 98.9°F | Wt 156.0 lb

## 2012-08-28 DIAGNOSIS — K219 Gastro-esophageal reflux disease without esophagitis: Secondary | ICD-10-CM | POA: Insufficient documentation

## 2012-08-28 DIAGNOSIS — Z9109 Other allergy status, other than to drugs and biological substances: Secondary | ICD-10-CM | POA: Insufficient documentation

## 2012-08-28 DIAGNOSIS — I10 Essential (primary) hypertension: Secondary | ICD-10-CM | POA: Insufficient documentation

## 2012-08-28 HISTORY — DX: Essential (primary) hypertension: I10

## 2012-08-28 LAB — POCT I-STAT, CHEM 8
BUN: 18 mg/dL (ref 6–23)
Calcium, Ion: 1.14 mmol/L (ref 1.12–1.23)
Chloride: 106 mEq/L (ref 96–112)
Creatinine, Ser: 1.3 mg/dL — ABNORMAL HIGH (ref 0.50–1.10)
Glucose, Bld: 102 mg/dL — ABNORMAL HIGH (ref 70–99)
TCO2: 22 mmol/L (ref 0–100)

## 2012-08-28 LAB — CBC
HCT: 41.2 % (ref 36.0–46.0)
MCHC: 34.5 g/dL (ref 30.0–36.0)
RDW: 12.8 % (ref 11.5–15.5)

## 2012-08-28 MED ORDER — LOSARTAN POTASSIUM-HCTZ 50-12.5 MG PO TABS: 1.0000 | ORAL_TABLET | Freq: Every day | ORAL | Status: DC

## 2012-08-28 MED ORDER — LOSARTAN POTASSIUM-HCTZ 50-12.5 MG PO TABS: 2.0000 | ORAL_TABLET | Freq: Every day | ORAL | Status: DC

## 2012-08-28 MED ORDER — LABETALOL HCL 5 MG/ML IV SOLN
20.0000 mg | Freq: Once | INTRAVENOUS | Status: AC
  Administered 2012-08-28: 20 mg via INTRAVENOUS
  Filled 2012-08-28: qty 4

## 2012-08-28 MED ORDER — AMLODIPINE BESYLATE 10 MG PO TABS
10.0000 mg | ORAL_TABLET | Freq: Once | ORAL | Status: AC
  Administered 2012-08-28: 10 mg via ORAL
  Filled 2012-08-28: qty 1

## 2012-08-28 MED ORDER — ACETAMINOPHEN 325 MG PO TABS
650.0000 mg | ORAL_TABLET | Freq: Once | ORAL | Status: AC
  Administered 2012-08-28: 650 mg via ORAL
  Filled 2012-08-28: qty 2

## 2012-08-28 MED ORDER — METOPROLOL TARTRATE 50 MG PO TABS: 25.0000 mg | ORAL_TABLET | Freq: Two times a day (BID) | ORAL | Status: DC

## 2012-08-28 NOTE — ED Provider Notes (Signed)
7:20 AM BP 185/128  Pulse 66  Temp 98.5 F (36.9 C) (Oral)  Resp 20  SpO2 100% Assumed care of paitient form Earley Favor, NP.  Patient is here for HTN.  Patient has been placed on Coreg by PCP.  Patient presented with BP 249/129.  She has received labetalol and 10mg  norvasc.  BP currently 212/114. No end organ damage on labs.  Denies headaches, light headedness, weakness, visual disturbances.  CV: RRR, No M/R/G, Peripheral pulses intact. No peripheral edema. Lungs: CTAB Abd: Soft, Non tender, non distended  7:37 AM Discussed with Dr. Dierdre Highman, who feels patient is safe for discharge and asks to add metoprolol to regimen.  Patient is instructed to f/u with her PCP today. Discussed signs of Hypotension. All questions answered fully. Discussed reasons to seek immediate care. Patient expresses understanding and agrees with plan.     Arthor Captain, PA-C 08/28/12 [phone]

## 2012-08-28 NOTE — Patient Instructions (Addendum)
Stop carvedilol, stay on metoprolol 25 mg twice a day Start losartan HCT 2 tablets every day (AM)----- for today, just take one this afternoon Call w/ BP readings tomorrow and the next day ER if severe headaches  Come back in one week, we need to check on you and do some blood. We need to closely followup your potassium and kidney function

## 2012-08-28 NOTE — ED Provider Notes (Signed)
History     CSN: [id number]  Arrival date & time 08/28/12  0116   First MD Initiated Contact with Patient 08/28/12 0131      Chief Complaint  Patient presents with  . Hypertension    (Consider location/radiation/quality/duration/timing/severity/associated sxs/prior treatment) HPI Comments: Patient has recently been started on blood pressure medication.  She's also had a birth control pill changed.  She's been seen by both her gynecologist and her primary care physician within the past several, days.  Woke up tonight feeling, "strange" don't headache, and just not feeling right.  She took her blood pressure at home, and it was greater than 200 over greater than 140 she came to the emergency department for further evaluation.  She, states she's been taking her medication as prescribed.  She does have a family history of high blood pressure.  She denies any shortness of breath chest pain, blurry vision, change in urinary output, appetite, weight, denies swelling in her legs  Patient is a 41 y.o. female presenting with hypertension. The history is provided by the patient.  Hypertension This is a new problem. The current episode started yesterday. The problem occurs constantly. The problem has been gradually worsening. Associated symptoms include headaches. Pertinent negatives include no abdominal pain, anorexia, chest pain, coughing, fatigue, fever, nausea, neck pain, numbness, rash, sore throat, urinary symptoms or weakness.    Past Medical History  Diagnosis Date  . Hx of colonic polyps   . Allergy     rhinitis  . GERD (gastroesophageal reflux disease)   . Hypertension     Past Surgical History  Procedure Date  . G1 p1   . Abdominal hysterectomy     endometriosis  . Eye surgery     prk bilateral    Family History  Problem Relation Age of Onset  . Hypertension Mother   . Hypertension Father   . Diabetes Other     ??  . Cancer Other     colon...GM    History  Substance  Use Topics  . Smoking status: Passive Smoke Exposure - Never Smoker  . Smokeless tobacco: Not on file  . Alcohol Use: Yes    OB History    Grav Para Term Preterm Abortions TAB SAB Ect Mult Living                  Review of Systems  Constitutional: Negative for fever and fatigue.  HENT: Negative for sore throat and neck pain.   Respiratory: Negative for cough and shortness of breath.   Cardiovascular: Negative for chest pain and leg swelling.  Gastrointestinal: Negative for nausea, abdominal pain and anorexia.  Musculoskeletal: Negative for back pain.  Skin: Negative for rash.  Neurological: Positive for headaches. Negative for dizziness, speech difficulty, weakness and numbness.    Allergies  Review of patient's allergies indicates no known allergies.  Home Medications   Current Outpatient Rx  Name Route Sig Dispense Refill  . CALCIUM 600 PO Oral Take 1 each by mouth daily.      Marland Kitchen CARVEDILOL 12.5 MG PO TABS Oral Take 1 tablet (12.5 mg total) by mouth 2 (two) times daily with a meal. 60 tablet 1  . CYCLOBENZAPRINE HCL 5 MG PO TABS Oral Take 1 tablet (5 mg total) by mouth 2 (two) times daily as needed for muscle spasms. 60 tablet 1  . LEVONORGEST-ETH ESTRAD 91-DAY 0.15-0.03 MG PO TABS Oral Take 1 tablet by mouth daily.      Marland Kitchen LORAZEPAM 0.5  MG PO TABS Oral Take 0.5 mg by mouth at bedtime as needed. For sleep    . ONE-DAILY MULTI VITAMINS PO TABS Oral Take 1 tablet by mouth daily.      . NORETHINDRONE 0.35 MG PO TABS Oral Take 1 tablet (0.35 mg total) by mouth daily. 1 Package 11  . PANTOPRAZOLE SODIUM 40 MG PO TBEC Oral Take 1 tablet (40 mg total) by mouth daily. 90 tablet 3    BP 226/142  Pulse 84  Temp 98.5 F (36.9 C) (Oral)  Resp 20  SpO2 100%  Physical Exam  Constitutional: She is oriented to person, place, and time. She appears well-developed and well-nourished.  HENT:  Head: Normocephalic.  Eyes: Pupils are equal, round, and reactive to light.  Neck: Normal  range of motion.  Cardiovascular: Normal rate.        Blood pressure is 226/142  Pulmonary/Chest: Effort normal.  Abdominal: Soft.  Musculoskeletal: Normal range of motion. She exhibits no edema and no tenderness.  Neurological: She is alert and oriented to person, place, and time.  Skin: Skin is warm and dry.    ED Course  Procedures (including critical care time)   Labs Reviewed  CBC   No results found.   No diagnosis found.  ED ECG REPORT   Date: 08/28/2012  EKG Time: 1:58 AM  Rate: 74  Rhythm: normal sinus rhythm,  there are no previous tracings available for comparison  Axis: normal  Intervals:none? L atrial abnormality  ST&T Change: none  Narrative Interpretation: borderline            MDM   HTN urgency without systemic symptoms  Will check cbc -electrolytes, ekg start IV and administer Labetalol IV and monitor After 40 mg Labetalol IV BP 170 will allow patient to follow up with Dr. Drue Novel today for his imput into further treatment and medication choice       Arman Filter, NP 08/28/12 2102

## 2012-08-28 NOTE — ED Notes (Signed)
Pt states started on bp meds 10/28; woke up tonight not feeling right; feeling like having a panic attack; denies chest pain; denies sob; slight headache x 2 days

## 2012-08-28 NOTE — Progress Notes (Signed)
  Subjective:    Patient ID: Abigail Mathis, female    DOB: [birth year], 41 y.o.   MRN: [id number]  HPI ER followup Very early today she woke up not feeling well, went to the ER complaining of a BP of 200/140, at the ER her BP was confirmatory and elevated at 226/142, she received IV labetalol and her systolic BP decreased to 170. Potassium is 3.8, CBC was normal, creatinine was 1.3 slightly higher than a few days ago. They recommend to take Lopressor which she took today at 58 AM. She also took her regular carvedilol at 9.30 AM. States that she felt better after she took Lopressor. 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 HTN-- F and M  Review of Systems At this point she is feeling better Denies chest pain shortness of breath Denies any nausea, vomiting. Admits to a mild headache, on and off, intensities 2/10. Located on the top of the head. Denies any nuchal pain or neck stiffness. on further questioning, this headache is going on for a couple of months and is not getting worse. Denies visual changes    Objective:   Physical Exam General -- alert, well-developed, and well-nourished.   Neck --FROM HEENT --  undilated funduscopy normal, no papilledema, no bleeding Lungs -- normal respiratory effort, no intercostal retractions, no accessory muscle use, and normal breath sounds.   Heart-- normal rate, regular rhythm, no murmur, and no gallop.   Extremities-- no pretibial edema bilaterally  Neurologic-- alert & oriented X3, EOMI, DTRs and strength normal in all extremities. Psych-- Cognition and  judgment appear intact. Alert and cooperative with normal attention span and concentration.  not anxious appearing and not depressed appearing.      Assessment & Plan:

## 2012-08-28 NOTE — ED Provider Notes (Signed)
Medical screening examination/treatment/procedure(s) were performed by non-physician practitioner and as supervising physician I was immediately available for consultation/collaboration.  Rodriques Badie, MD 08/28/12 2303 

## 2012-08-28 NOTE — Assessment & Plan Note (Addendum)
Patient went to the ER today c/o a blood pressure of 260/140, at the ER her BP was confirmed to be elevated at 226/142. She responded well to IV labetalol. She complains of some headaches, I'm concern about the HAs  however the headaches are mild, going on for about 2 months, neurological exam and  funduscopy are normal. States her sister takes a medication that cause swelling (likely amlodipine) consequently will try to stay away from it. Plan: Discontinue Coreg, continue with metoprolol which she likes better Start losartan HCT 50-12.5---->  2 po qd, see instructions Needs close f/u: RTC 1 week for BP check and BMP I carefully discussed the plan of care  with the patient, see instructions

## 2012-08-28 NOTE — ED Provider Notes (Signed)
Medical screening examination/treatment/procedure(s) were performed by non-physician practitioner and as supervising physician I was immediately available for consultation/collaboration.  Sunnie Nielsen, MD 08/28/12 2303

## 2012-08-29 ENCOUNTER — Telehealth: Payer: Self-pay

## 2012-08-29 NOTE — Telephone Encounter (Signed)
BP still elevated Hyzaar is 2 tablets every morning We can increase metoprolol from 25 twice a day to 50 mg twice a day Call with BP in the morning

## 2012-08-29 NOTE — Telephone Encounter (Signed)
Pt states has taken 2 Hyzaar last night and 2 this morning and wonders does she suppose to take 2 more today (meaning 4 per day)? Pt has appt next Thursday. Pt states no HA and feels fine. Pt states instructions on Rx was diff. Needed clarity. Pt BP [id number]  Plz advise      MW

## 2012-08-29 NOTE — Telephone Encounter (Signed)
lmovm for pt to return call.  

## 2012-08-30 ENCOUNTER — Telehealth: Payer: Self-pay | Admitting: Internal Medicine

## 2012-08-30 NOTE — Telephone Encounter (Signed)
noted 

## 2012-08-30 NOTE — Telephone Encounter (Signed)
Pt will not be able to check her BP readings until after 5pm when she gets home from work. See previous phone note.

## 2012-08-30 NOTE — Telephone Encounter (Signed)
[facility] Triage Call Report Triage Record Num: [id number] Operator: Candida Peeling Patient Name: Abigail Mathis Call Date & Time: 08/29/2012 7:26:03PM Patient Phone: [phone] PCP: Nolon Rod. Paz Patient Gender: Female PCP Fax : Patient DOB: 09-Jun-1971 Practice Name: [facility] Reason for Call: Caller: [name]/Patient; PCP: Willow Ora; CB#: [phone]; Call regarding Medication Issue; Medication(s): losartan 12.5 mg; Called office earlier, needing instructions on Losartan 12.5; received voice mail msg w/ no instructions except to call the office. Caller received message after 1800. Epic reviewed caller advised Losartan is only 2 tabs every morning but increase Metoprolol to 50 mgm twice daily. Caller has taken Metoprolol 25 mgm this am and pm, RN instructed to take an additional 25 mgm this pm and report BP in am. RN instructed Losartan 2 tabs tomorrow am, Metoprolol 50 mgm in am and call office w/ BP reading. Denies any new/worsening sxs requiring triage. Protocol(s) Used: Office Note Recommended Outcome per Protocol: Information Noted and Sent to Office Reason for Outcome: Caller information to office Care Advice: ~

## 2012-08-30 NOTE — Telephone Encounter (Signed)
Spoke with the pt. The readings that she got this morning is before taking her BP meds. She said that she is at work & will not be able to check her BP again until after 5pm when she gets home.

## 2012-08-30 NOTE — Telephone Encounter (Signed)
Call pt later today, BP readings?

## 2012-08-30 NOTE — Telephone Encounter (Signed)
Pt called to give [place] her BP reading: L arm 159-109    R arm 183/116. Pt does not currently have her bp cuff with her. Call [phone].

## 2012-09-02 ENCOUNTER — Telehealth: Payer: Self-pay

## 2012-09-02 ENCOUNTER — Telehealth: Payer: Self-pay | Admitting: Internal Medicine

## 2012-09-02 MED ORDER — AMLODIPINE BESYLATE 10 MG PO TABS: 10.0000 mg | ORAL_TABLET | Freq: Every day | ORAL | Status: DC

## 2012-09-02 NOTE — Telephone Encounter (Signed)
Current medications: Hyzaar 2 tablets daily Metoprolol 50 mg twice a day Plan: Add amlodipine 10 mg one by mouth daily Ov in 3 days as planned

## 2012-09-02 NOTE — Telephone Encounter (Signed)
Patient calling to see if the Amlodipine was called to the pharmacy.  Same is showing that it was sent earlier today.  She will check with the pharmacy.

## 2012-09-02 NOTE — Telephone Encounter (Signed)
Spoke to pt to advise   Per Drue Novel Current medications: Hyzaar 2 tablets daily Metoprolol 50 mg twice a day Plan: Add amlodipine 10 mg one by mouth daily Ov in 3 days as planned   Pt verified pharmacy and stated understanding.

## 2012-09-02 NOTE — Telephone Encounter (Signed)
BP 198/120 Yesterday, 170/113 today. Pt states the meds she is on still hasn't gotten her BP to normal range. Plz advise    MW

## 2012-09-03 NOTE — Telephone Encounter (Signed)
Spoke with pt & she verified that she did get the rx from the pharmacy.

## 2012-09-04 ENCOUNTER — Ambulatory Visit: Payer: BC Managed Care – PPO | Admitting: Internal Medicine

## 2012-09-05 ENCOUNTER — Ambulatory Visit (INDEPENDENT_AMBULATORY_CARE_PROVIDER_SITE_OTHER): Payer: BC Managed Care – PPO | Admitting: Internal Medicine

## 2012-09-05 ENCOUNTER — Encounter: Payer: Self-pay | Admitting: Internal Medicine

## 2012-09-05 ENCOUNTER — Telehealth: Payer: Self-pay | Admitting: Obstetrics and Gynecology

## 2012-09-05 ENCOUNTER — Ambulatory Visit (HOSPITAL_BASED_OUTPATIENT_CLINIC_OR_DEPARTMENT_OTHER)
Admission: RE | Admit: 2012-09-05 | Discharge: 2012-09-05 | Disposition: A | Discharge status: Home / Self Care | Payer: BC Managed Care – PPO | Source: Ambulatory Visit | Attending: Internal Medicine | Admitting: Internal Medicine

## 2012-09-05 VITALS — BP 136/104 | HR 71 | Temp 98.2°F | Wt 154.0 lb

## 2012-09-05 DIAGNOSIS — R51 Headache: Secondary | ICD-10-CM

## 2012-09-05 DIAGNOSIS — R42 Dizziness and giddiness: Secondary | ICD-10-CM | POA: Insufficient documentation

## 2012-09-05 DIAGNOSIS — I10 Essential (primary) hypertension: Secondary | ICD-10-CM

## 2012-09-05 LAB — BASIC METABOLIC PANEL
BUN: 15 mg/dL (ref 6–23)
CO2: 26 mEq/L (ref 19–32)
Calcium: 9.4 mg/dL (ref 8.4–10.5)
Chloride: 99 mEq/L (ref 96–112)
Creatinine, Ser: 1.1 mg/dL (ref 0.4–1.2)
GFR: 69.45 mL/min (ref >60.00)
Glucose, Bld: 100 mg/dL — ABNORMAL HIGH (ref 70–99)
Potassium: 3.8 mEq/L (ref 3.5–5.1)
Sodium: 135 mEq/L (ref 135–145)

## 2012-09-05 MED ORDER — METOPROLOL TARTRATE 50 MG PO TABS: 75.0000 mg | ORAL_TABLET | Freq: Two times a day (BID) | ORAL | Status: DC

## 2012-09-05 NOTE — Telephone Encounter (Signed)
Spoke with pt rgd msg. Pt was unsure if it was ok to be put on the recall list for January for her 3 month f/u. Advised pt that she seen ND in 10/13 and wants a 3 month f/u rgd b/c and bp. Pt's voice understanding. bt cma

## 2012-09-05 NOTE — Progress Notes (Signed)
  Subjective:    Patient ID: Abigail Mathis, female    DOB: Nov 25, 1970, 41 y.o.   MRN: [id number]  HPI Hypertension followup Since the last time she was here, she is taking the medication as prescribed. After we added amlodipine, she noted some side effects: dizzy sometimes, not necessarily immediately after any particular medication, worse when she moves around, also "dependent on what i eat" She feels fatigued sometimes. Occasional short of breath at night Ambulatory BP today several and 64/101, yesterday 145/103.   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 HTN-- F and M  Review of Systems Some stress but no actual anxiety and depression. No chest pain or palpitations No cough Still has occasional headache mostly on the left side. Currently not taking a lot of Motrin, not taking any diet supplements were over-the-counter medicines except for Tylenol and glucosamine; trying to watch her salt intake already.     Objective:   Physical Exam General -- alert, well-developed, and well-nourished.   Neck --no thyromegaly , normal carotid pulse, no LADs  Lungs -- normal respiratory effort, no intercostal retractions, no accessory muscle use, and normal breath sounds.   Heart-- normal rate, regular rhythm, no murmur, and no gallop.   Extremities-- no pretibial edema bilaterally Neurologic-- alert & oriented X3, speech, gait and strength normal in all extremities. Psych-- Cognition and judgment appear intact. Alert and cooperative with normal attention span and concentration.  not anxious appearing and not depressed appearing.    BP lying down 146/100, pulse 66 Sitting 138/96, pulse 61 Standing 136/108, pulse 71     Assessment & Plan:

## 2012-09-05 NOTE — Patient Instructions (Addendum)
Continue losartan-HCT 2 a day  Increase metoprolol 50 mg to ----> 1.5 tablet twice a day decrease amlodipine to 1/2 tab a day Come back in 2 weeks Currently BP readings in 10 days ER if severe headache

## 2012-09-06 ENCOUNTER — Telehealth: Payer: Self-pay | Admitting: Internal Medicine

## 2012-09-06 ENCOUNTER — Encounter: Payer: Self-pay | Admitting: Internal Medicine

## 2012-09-06 MED ORDER — AMLODIPINE BESYLATE 5 MG PO TABS: 5.0000 mg | ORAL_TABLET | Freq: Every day | ORAL | Status: DC

## 2012-09-06 NOTE — Telephone Encounter (Signed)
Caller: Zakaria/Patient; Patient Name: Abigail Mathis; PCP: Willow Ora; Best Callback Phone Number: [phone].  Patient calling about referral to nephrologist; would like to see Dr. Leeann Must  if possible.  Also switched amlodipine to 5mg  from 10mg , and she is cutting the tablets in half with a pillcutter, but feels she is not getting accurate dosing.  Would like Rx for 5mg  amlodipine if possible.  States he also changed her metoprolol to 75mg  daily, but states her pharmacy will not fill it until 09/07/12 so she has not changed that yet.   Info to office for staff/provider review.   Uses [facility]. May reach patient at [phone].

## 2012-09-06 NOTE — Telephone Encounter (Signed)
Rx sent in for 5 mg tab of norvasc, called pharmacy and advise to fill Rx for metopeolol sent in on 09-05-12 due to increase/change in med. Per pharmacy will speak with insurance to see if they will approve Rx.

## 2012-09-06 NOTE — Assessment & Plan Note (Signed)
Blood pressure was in an issue up until few weeks ago suddenly she started to have quite elevated BPs. She is on a different BCP b/c her BP was elevated so doubt the new BCP is playing a significant role on her BP Current medication is losartan HCT 50/12.52 tablets daily Metoprolol 50 mg twice a day Amlodipine 10 mg daily Since she started amlodipine, she is experiencing some   side effects, see history of present illness. Plan: Continue losartan-HCT Increase metoprolol 50 mg to 1.5 tablet twice a day decrease amlodipine to 5 mg daily Refer to nephrology given the rather sudden onset of quite elevated BP Renal u/s  She continue with headaches, in the setting of elevated BP I think is important to rule out ICB --->  Schedule a CT addendum Again the patient is reluctant to take medications because she has side effects, i discussed with  her the risk of stroke heart attacks with uncontrolled BP. Explain her we have to work together on getting her BP well-controlled with a minimal amount of medicines and minimal amount of side effects. I spent 45 minutes with her today. Multiple questions answered to the best of my ability. Addendum: CT head negative, will let her know

## 2012-09-06 NOTE — Telephone Encounter (Signed)
Ok, I'll send a note to Luster Landsberg about is ok to see Dr Leeann Must (although I don't recognize the name)

## 2012-09-09 ENCOUNTER — Encounter: Payer: Self-pay | Admitting: *Deleted

## 2012-09-09 NOTE — Addendum Note (Signed)
Addended by: Edwena Felty T on: 09/09/2012 03:12 PM   Modules accepted: Orders

## 2012-09-12 ENCOUNTER — Other Ambulatory Visit: Payer: BC Managed Care – PPO

## 2012-09-17 ENCOUNTER — Ambulatory Visit
Admission: RE | Admit: 2012-09-17 | Discharge: 2012-09-17 | Disposition: A | Discharge status: Home / Self Care | Payer: BC Managed Care – PPO | Source: Ambulatory Visit | Attending: Internal Medicine | Admitting: Internal Medicine

## 2012-09-17 DIAGNOSIS — I10 Essential (primary) hypertension: Secondary | ICD-10-CM

## 2012-09-18 ENCOUNTER — Encounter: Payer: Self-pay | Admitting: Internal Medicine

## 2012-09-18 ENCOUNTER — Ambulatory Visit (INDEPENDENT_AMBULATORY_CARE_PROVIDER_SITE_OTHER): Payer: BC Managed Care – PPO | Admitting: Internal Medicine

## 2012-09-18 VITALS — BP 130/84 | HR 84 | Temp 98.0°F | Wt 159.0 lb

## 2012-09-18 DIAGNOSIS — I10 Essential (primary) hypertension: Secondary | ICD-10-CM

## 2012-09-18 NOTE — Assessment & Plan Note (Addendum)
Since her last office visit, CT of the head was negative, renal ultrasound negative. BP is now under better control. Side effects? Unclear if numbness, stiffness, tingling in her extremities related to medication. She is convinced that is the case. We agreed to decrease metoprolol dose as she thinks that either metoprolol or amlodipine are the culptits. Will also check a CK, vitamin D, Mag level. Decrease metoprolol  from 75 mg twice a day to 50 mg twice a day. Needs to be seen by nephrology as I suspect she may be intolerant to current meds

## 2012-09-18 NOTE — Progress Notes (Signed)
  Subjective:    Patient ID: Abigail Mathis, female    DOB: 1971-09-24, 41 y.o.   MRN: [id number]  HPI Hypertension followup Good compliance with meds as prescribed, ambulatory BPs around 130/80. The patient however describes a number of side effects that seemed to started after the amlodipine was added and the metoprolol dose increased: Ill-defined numbness, stiffness, tingling on and off in the whole upper  or lower extremities, she has good days and bad days. Also she feels more soreness after exercise. Occasionally gets nervous and drowsy. Reports that side effects are severe and something needs to be done.  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 HTN-- F and M    Review of Systems Headache is resolved No lower extremity edema Denies any back pain, neck pain or rash.     Objective:   Physical Exam  General -- alert, well-developed Lungs -- normal respiratory effort, no intercostal retractions, no accessory muscle use, and normal breath sounds.   Heart-- normal rate, regular rhythm, no murmur, and no gallop.   Extremities-- no pretibial edema bilaterally  Psych-- Cognition and judgment appear intact. Alert and cooperative with normal attention span and concentration.  slt  anxious appearing and not depressed appearing.      Assessment & Plan:

## 2012-09-20 ENCOUNTER — Other Ambulatory Visit: Payer: Self-pay | Admitting: Internal Medicine

## 2012-09-20 NOTE — Telephone Encounter (Signed)
Refill done.  

## 2012-09-22 LAB — VITAMIN D 1,25 DIHYDROXY: Vitamin D 1, 25 (OH)2 Total: 71 pg/mL (ref 18–72)

## 2012-09-23 ENCOUNTER — Encounter: Payer: Self-pay | Admitting: *Deleted

## 2012-10-01 ENCOUNTER — Other Ambulatory Visit: Payer: Self-pay

## 2012-10-01 MED ORDER — NORETHINDRONE 0.35 MG PO TABS: ORAL_TABLET | ORAL | Status: DC

## 2012-10-03 ENCOUNTER — Ambulatory Visit: Payer: BC Managed Care – PPO | Admitting: Internal Medicine

## 2012-10-04 ENCOUNTER — Telehealth: Payer: Self-pay

## 2012-10-04 NOTE — Telephone Encounter (Signed)
Lm on vm tcb rgd msg 

## 2012-10-04 NOTE — Telephone Encounter (Signed)
Spoke with pt rgd concerns with bc pt states taking bc continuously but pharmacy will not fill rx until 10/07/12 spoke with [facility] they state pt can have rx whenever she want but insurance will only let her fill it every 21 days informed pt pt voice understadning

## 2012-12-01 ENCOUNTER — Other Ambulatory Visit: Payer: Self-pay | Admitting: Internal Medicine

## 2012-12-02 NOTE — Telephone Encounter (Signed)
Refill done.  

## 2012-12-10 ENCOUNTER — Telehealth: Payer: Self-pay | Admitting: *Deleted

## 2012-12-10 ENCOUNTER — Ambulatory Visit (INDEPENDENT_AMBULATORY_CARE_PROVIDER_SITE_OTHER): Payer: BC Managed Care – PPO | Admitting: Internal Medicine

## 2012-12-10 VITALS — BP 112/74 | HR 72 | Temp 98.1°F | Wt 159.0 lb

## 2012-12-10 DIAGNOSIS — J020 Streptococcal pharyngitis: Secondary | ICD-10-CM

## 2012-12-10 DIAGNOSIS — I10 Essential (primary) hypertension: Secondary | ICD-10-CM

## 2012-12-10 DIAGNOSIS — J039 Acute tonsillitis, unspecified: Secondary | ICD-10-CM

## 2012-12-10 MED ORDER — AMOXICILLIN 500 MG PO CAPS: 1000.0000 mg | ORAL_CAPSULE | Freq: Two times a day (BID) | ORAL | Status: DC

## 2012-12-10 NOTE — Progress Notes (Signed)
  Subjective:    Patient ID: Abigail Mathis, female    DOB: 10/01/1971, 42 y.o.   MRN: [id number]  HPI Acute visit Symptoms started 2 days ago with bilateral neck gland swelling, sore throat, hurts to swallow, postnasal dripping. Also, she saw nephrology, medication was changed, note reviewed, see assessment and plan.  Past Medical History  Diagnosis Date  . Hx of colonic polyps     3th Cscope 11-08 tics, repeat in 10 years per patient     . GERD (gastroesophageal reflux disease)   . Hypertension   . Endometriosis   . Rhinitis, allergic    Past Surgical History  Procedure Laterality Date  . G1 p1    . Abdominal hysterectomy      endometriosis  . 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 Has sinus congestion and discomfort on and off, no nasal discharge. Mild cough without sputum production.     Objective:   Physical Exam  Neck:     General -- alert, well-developed    HEENT -- TMs normal, throat w/o redness, tonsils with few white patches, very mild redness; face symmetric and not tender to palpation,  nose moderately congested but no discharge Lungs -- normal respiratory effort, no intercostal retractions, no accessory muscle use, and normal breath sounds.   Heart-- normal rate, regular rhythm, no murmur, and no gallop.   Extremities-- no pretibial edema bilaterally Neurologic-- alert & oriented X3 and strength normal in all extremities. Psych-- Cognition and judgment appear intact. Alert and cooperative with normal attention span and concentration.  not anxious appearing and not depressed appearing.      Assessment & Plan:   Tonsillitis, Symptoms consistent with tonsillitis, viral versus strep. Start amoxicillin, will also check a culture, if cx negative will discontinue antibiotics. See instructions  Today , I spent  more than 25  min with the patient, >50% of the time counseling- reviewing the note from nephrology and assessing an acute problem

## 2012-12-10 NOTE — Assessment & Plan Note (Addendum)
Recently saw nephrology, extensive note is reviewed, they discontinued losartan and started zestoretic, metoprolol decreased to 25 twice a day. Was recommended to discontinue NSAIDs and Cox inhibitors.  BP today is very good.

## 2012-12-10 NOTE — Telephone Encounter (Signed)
Pt would like to know if it would be ok to return to work on tomorrow depending on how she feels. .Please advise

## 2012-12-10 NOTE — Patient Instructions (Addendum)
Rest, fluids , tylenol If cough, take Mucinex DM twice a day as needed  Take the antibiotic as prescribed  (Amoxicillin) Call if no better in few days Call anytime if the symptoms are severe

## 2012-12-11 NOTE — Telephone Encounter (Signed)
Yes, ok to return to work if she feels better

## 2012-12-11 NOTE — Telephone Encounter (Signed)
Discuss with patient  

## 2012-12-12 ENCOUNTER — Encounter: Payer: Self-pay | Admitting: Internal Medicine

## 2012-12-12 LAB — CULTURE, GROUP A STREP: Organism ID, Bacteria: NORMAL

## 2012-12-14 ENCOUNTER — Ambulatory Visit (INDEPENDENT_AMBULATORY_CARE_PROVIDER_SITE_OTHER): Payer: BC Managed Care – PPO | Admitting: Internal Medicine

## 2012-12-14 ENCOUNTER — Encounter: Payer: Self-pay | Admitting: Internal Medicine

## 2012-12-14 VITALS — BP 92/60 | HR 71 | Temp 98.2°F | Wt 156.0 lb

## 2012-12-14 DIAGNOSIS — B029 Zoster without complications: Secondary | ICD-10-CM

## 2012-12-14 DIAGNOSIS — R109 Unspecified abdominal pain: Secondary | ICD-10-CM

## 2012-12-14 DIAGNOSIS — R070 Pain in throat: Secondary | ICD-10-CM

## 2012-12-14 MED ORDER — VALACYCLOVIR HCL 1 G PO TABS: 1000.0000 mg | ORAL_TABLET | Freq: Three times a day (TID) | ORAL | Status: DC

## 2012-12-14 MED ORDER — TRIAMCINOLONE ACETONIDE 0.1 % MT PSTE: PASTE | Freq: Two times a day (BID) | OROMUCOSAL | Status: DC

## 2012-12-14 NOTE — Assessment & Plan Note (Addendum)
2/14 - poss due to NSAIDs Prilosec OTC if out of protonix

## 2012-12-14 NOTE — Assessment & Plan Note (Addendum)
2/14 L hard palate zoster vs H simplex Triamc in dental paste

## 2012-12-14 NOTE — Assessment & Plan Note (Addendum)
2/14 L hard palate --- vs extensive H simplex Start Valtrex x 7 d

## 2012-12-14 NOTE — Patient Instructions (Addendum)
Prilosec OTC if out of protonix

## 2012-12-14 NOTE — Progress Notes (Signed)
   Subjective:    HPI  Symptoms started 7-8 days ago with bilateral neck gland swelling, sore throat, hurts to swallow, postnasal dripping. She took amoxicillin - not better. Strep test was neg She started to have abd pain yesterday; threw up once - no diarrhea... Also, she saw nephrology, medication was changed, note reviewed, see assessment and plan.  Past Medical History  Diagnosis Date  . Hx of colonic polyps     3th Cscope 11-08 tics, repeat in 10 years per patient     . GERD (gastroesophageal reflux disease)   . Hypertension   . Endometriosis   . Rhinitis, allergic    Past Surgical History  Procedure Laterality Date  . G1 p1    . Abdominal hysterectomy      endometriosis  . 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  HENT: Positive for sore throat (L side), mouth sores, trouble swallowing and dental problem. Negative for sinus pressure.   Respiratory: Negative for cough and wheezing.   Cardiovascular: Negative for palpitations.  Genitourinary: Negative for genital sores.   Denies fever, some chills. Has sinus congestion and discomfort on and off, no nasal discharge. Mild cough without sputum production.     Objective:   Physical Exam  Constitutional: She appears well-developed and well-nourished. No distress.  HENT:  Head: Normocephalic.  Right Ear: External ear normal.  Left Ear: External ear normal.  Nose: Nose normal.  A cluster of 4 ulcers on L hard palate   Eyes: Conjunctivae are normal. Pupils are equal, round, and reactive to light. Right eye exhibits no discharge. Left eye exhibits no discharge.  Neck: Normal range of motion. Neck supple. No JVD present. No tracheal deviation present. No thyromegaly present.    Cardiovascular: Normal rate, regular rhythm and normal heart sounds.     Pulmonary/Chest: No stridor. No respiratory distress. She has no wheezes.  Abdominal: Soft. Bowel sounds are normal. She exhibits no distension and no mass. There is no tenderness. There is no rebound and no guarding.  Musculoskeletal: She exhibits no edema and no tenderness.  Lymphadenopathy:    She has cervical adenopathy (L side).  Neurological: She displays normal reflexes. No cranial nerve deficit. She exhibits normal muscle tone. Coordination normal.  Skin: No rash noted. No erythema.  Psychiatric: She has a normal mood and affect. Her behavior is normal. Judgment and thought content normal.        Assessment & Plan:

## 2012-12-15 IMAGING — CT CT HEAD W/O CM
1 series · 16 of 30 positions shown, 20 images · non-contrast
Comparison: None.

CLINICAL DATA: Hypertension with headache and dizziness

CT HEAD WITHOUT CONTRAST
TECHNIQUE: Contiguous axial images were obtained from the base of
the skull through the vertex without contrast.

[Series 2: head 4.8 h37s · axial · 0.42mm/px · z∈[-166,-33]mm · 16 of 32 slices shown, 20 images]
[im 2/32  brain]
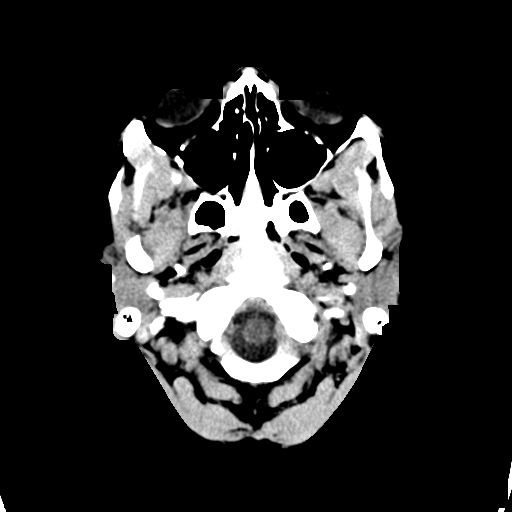
[im 2/32  bone]
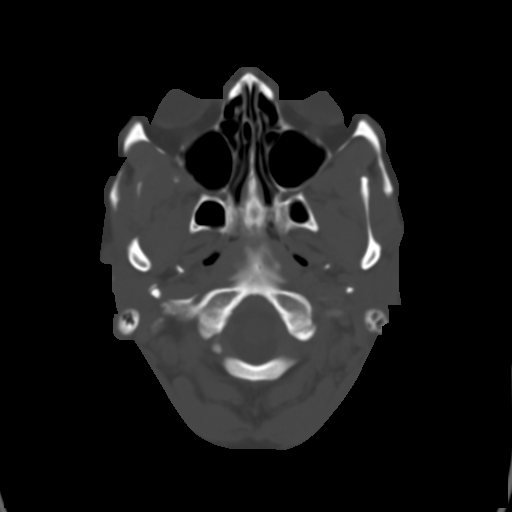
[im 4/32  brain]
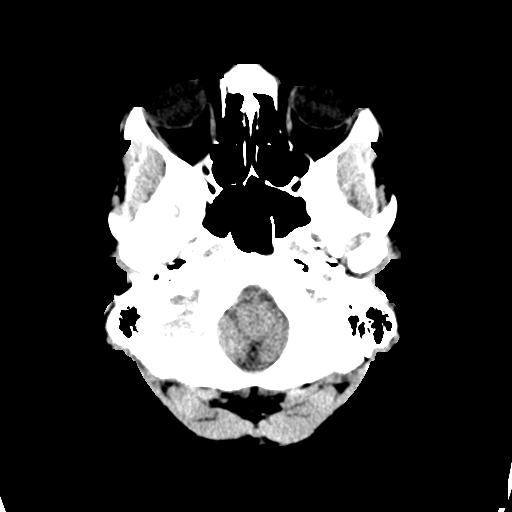
[im 6/32  brain]
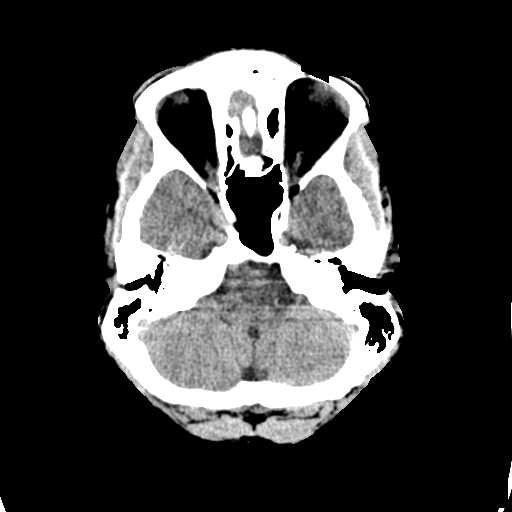
[im 8/32  brain]
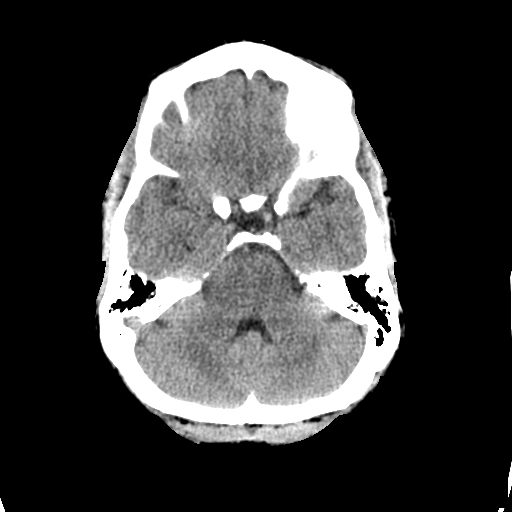
[im 9/32  brain]
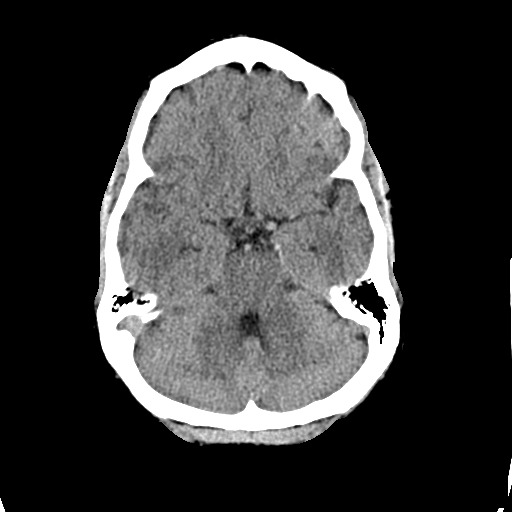
[im 9/32  bone]
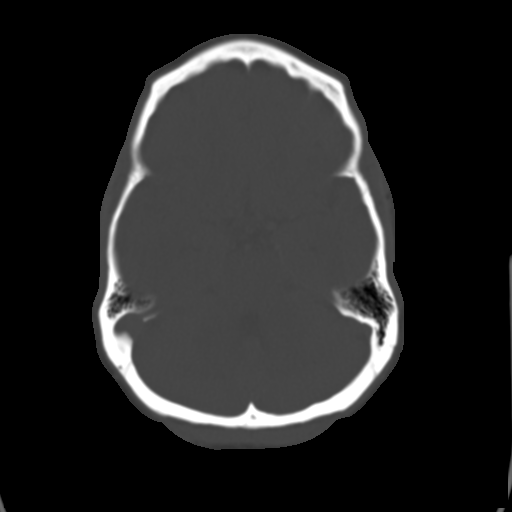
[im 11/32  brain]
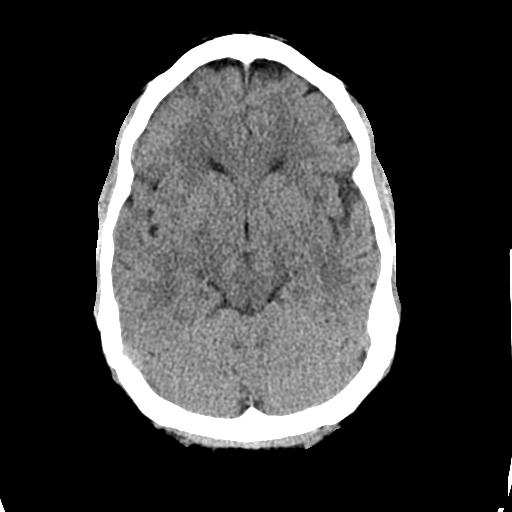
[im 13/32  brain]
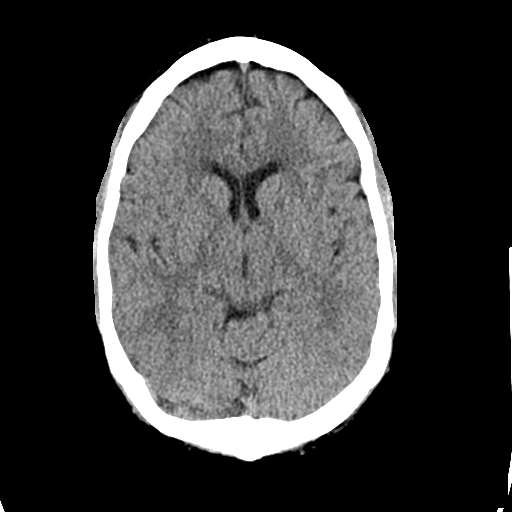
[im 15/32  brain]
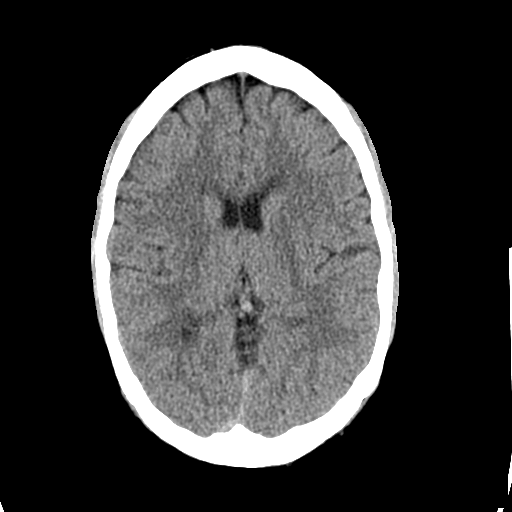
[im 17/32  brain]
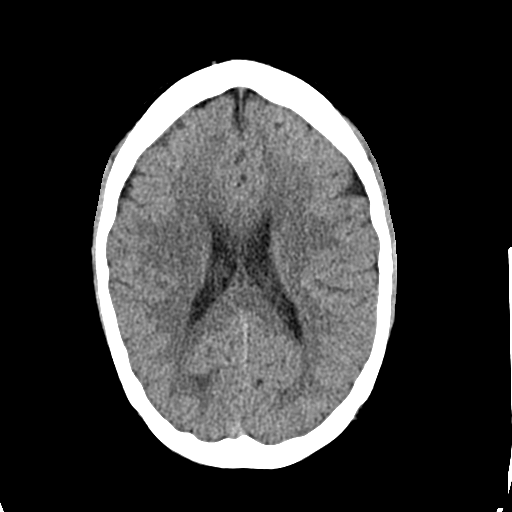
[im 17/32  bone]
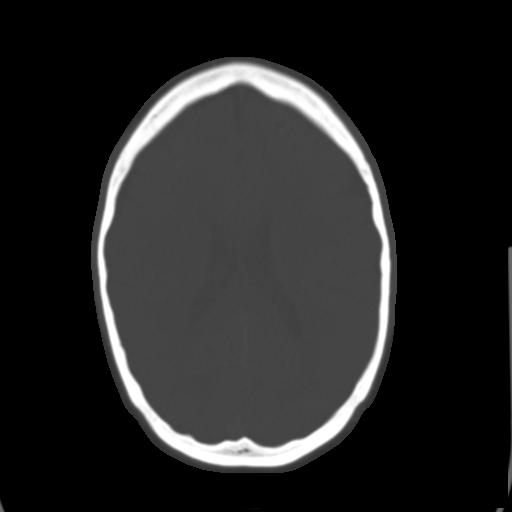
[im 19/32  brain]
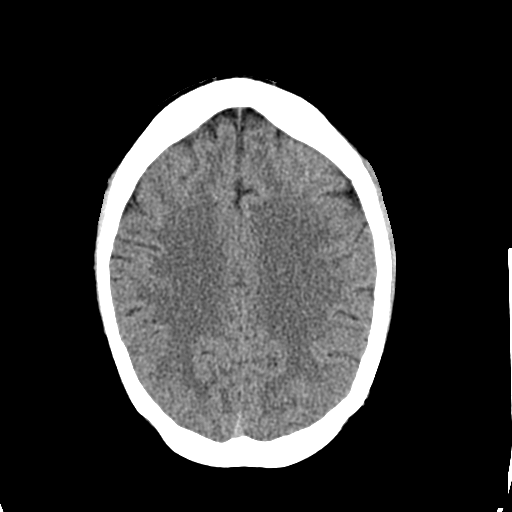
[im 21/32  brain]
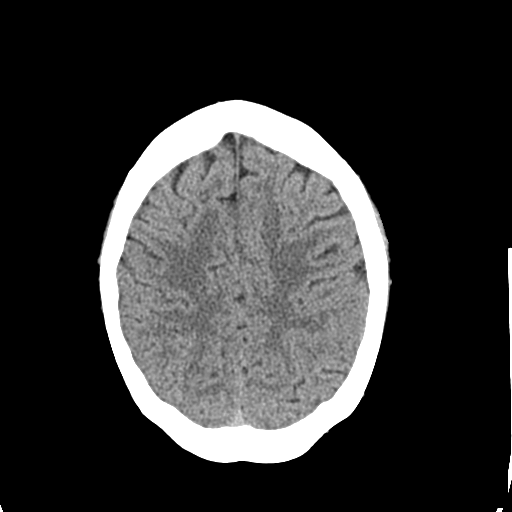
[im 23/32  brain]
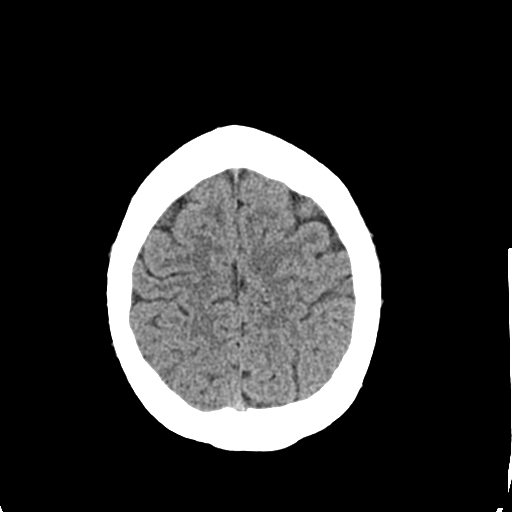
[im 24/32  brain]
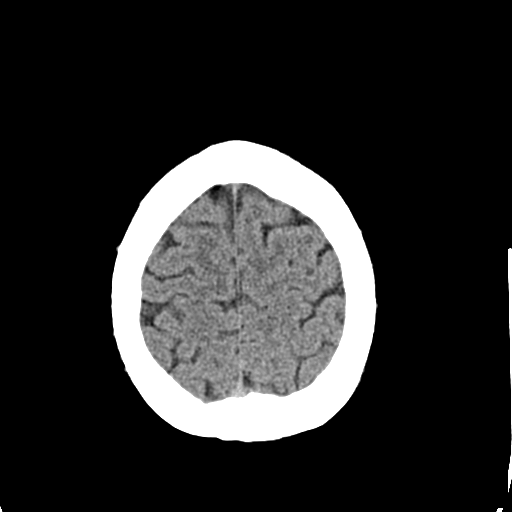
[im 24/32  bone]
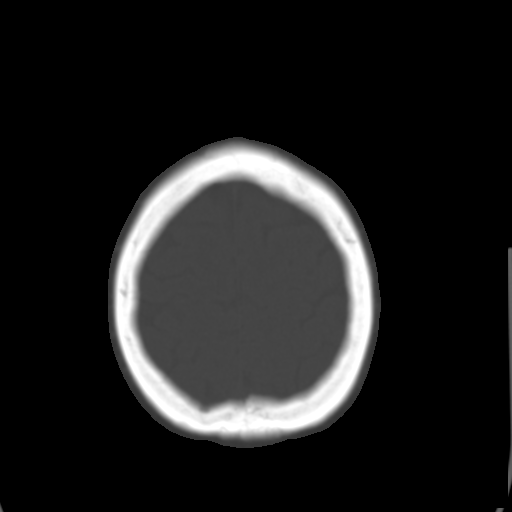
[im 26/32  brain]
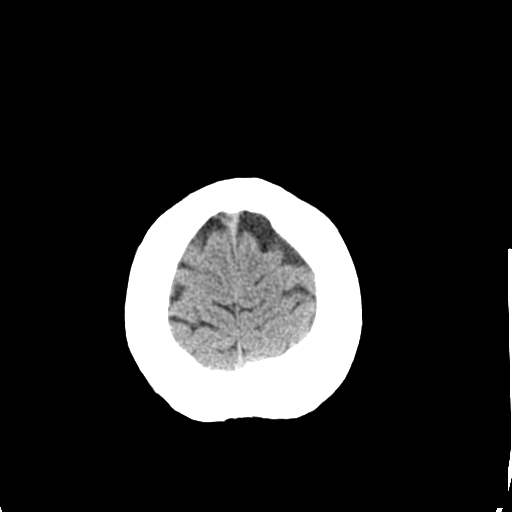
[im 28/32  brain]
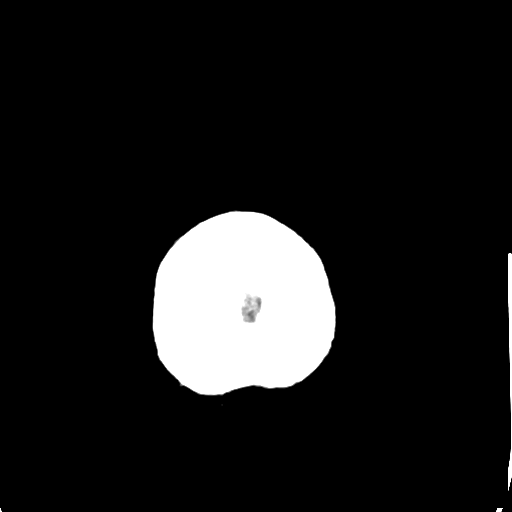
[im 30/32  brain]
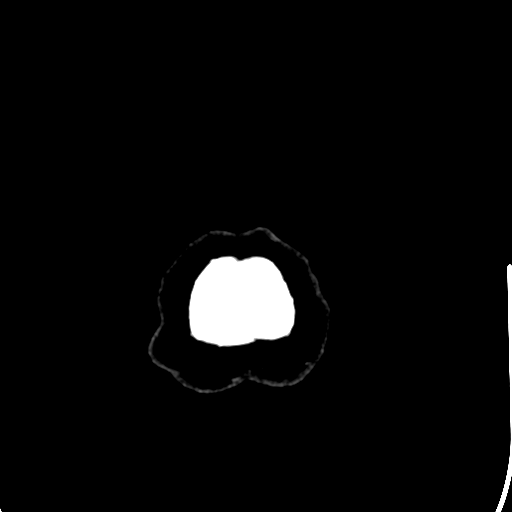

[16 of 30 positions shown; findings below may reference images not displayed]

FINDINGS: Ventricles are normal in size and configuration.  There
is no mass, hemorrhage, extra-axial fluid collection, or midline
shift.  Gray-white compartments are normal.  Bony calvarium appears
intact.  The mastoid air cells are clear.
IMPRESSION: Normal study.

## 2012-12-19 ENCOUNTER — Ambulatory Visit: Payer: BC Managed Care – PPO | Admitting: Internal Medicine

## 2012-12-27 IMAGING — US US RENAL
1 series · 13 of 25 positions shown · non-contrast
Comparison: Abdominal CT 05/17/2004

RENAL/URINARY TRACT ULTRASOUND

CLINICAL DATA: Hypertension.  Evaluate for renal artery stenosis
and medical renal disease.

RENAL/URINARY TRACT ULTRASOUND
RENAL DUPLEX ULTRASOUND
TECHNIQUE: Routine ultrasound examination of the kidneys and
urinary tract was performed.  Duplex and color Doppler ultrasound
was utilized to evaluate blood flow in the renal arteries and
kidneys.

[Series 1: us renal · 0.20mm/px · 13 of 68 slices shown]
[im 1/68]
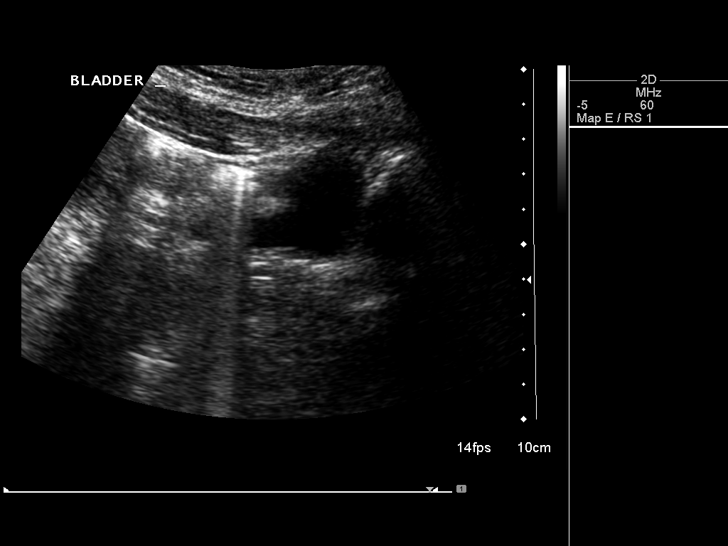
[im 6/68]
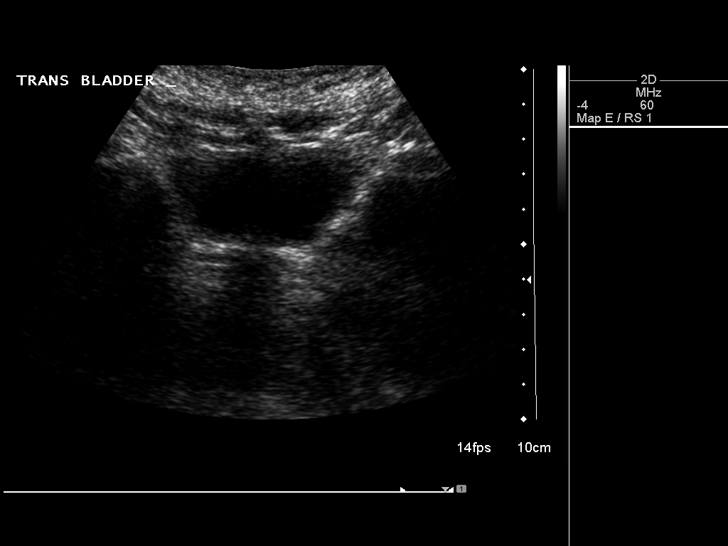
[im 12/68]
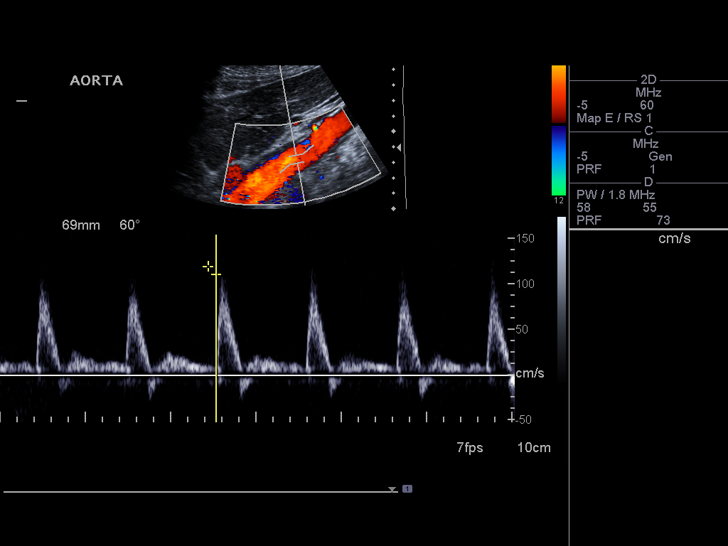
[im 17/68]
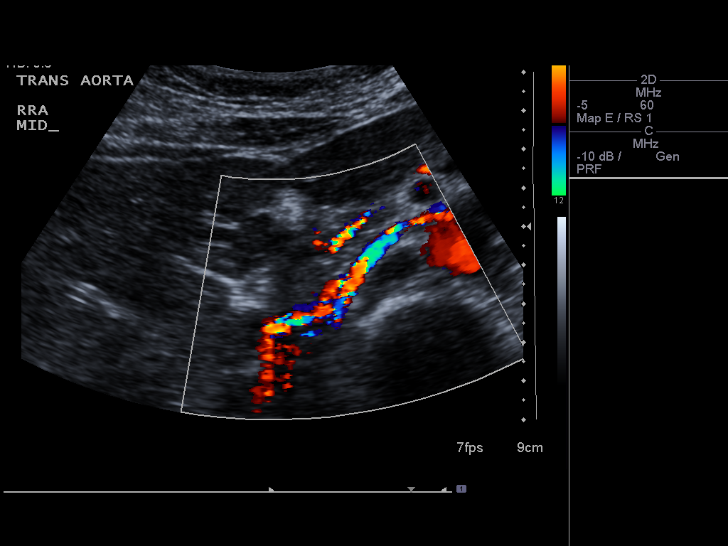
[im 23/68]
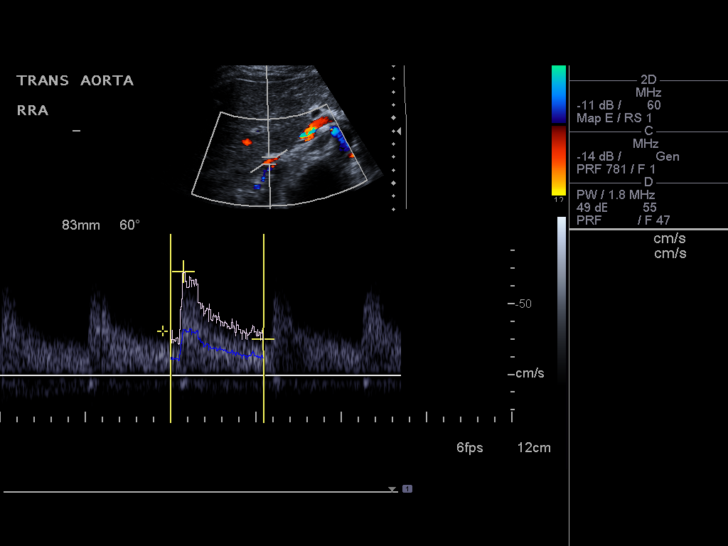
[im 28/68]
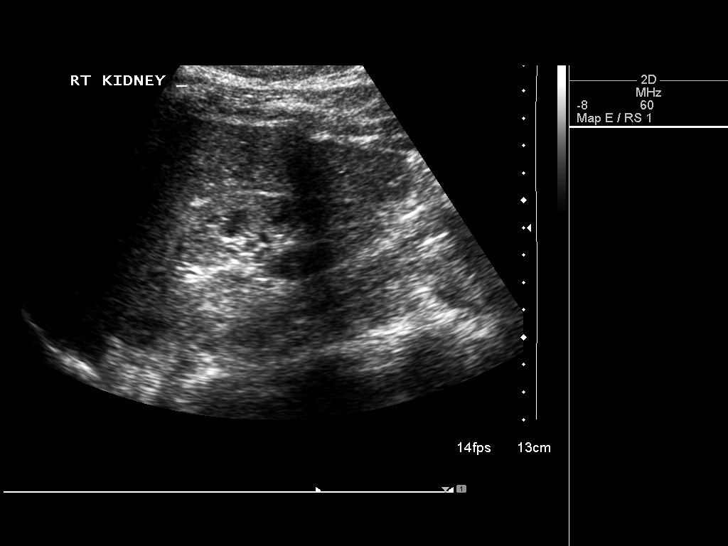
[im 34/68]
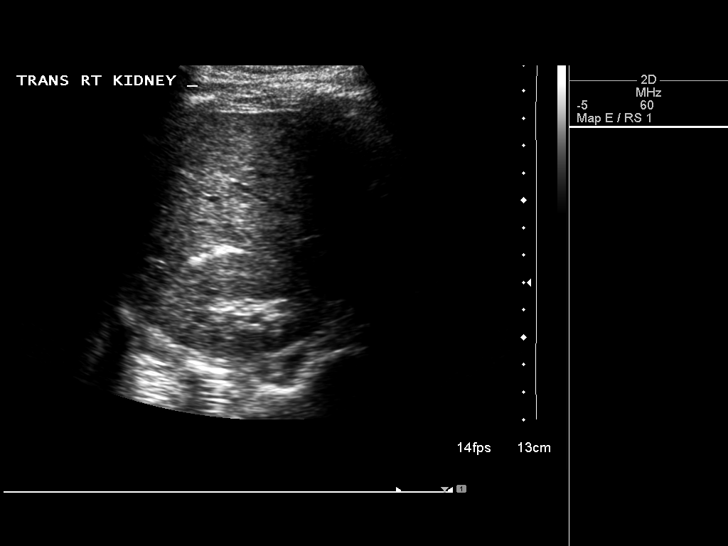
[im 40/68]
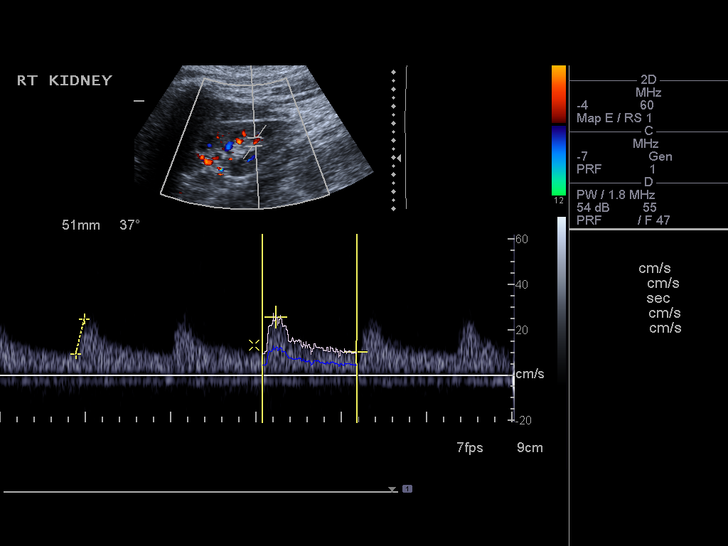
[im 45/68]
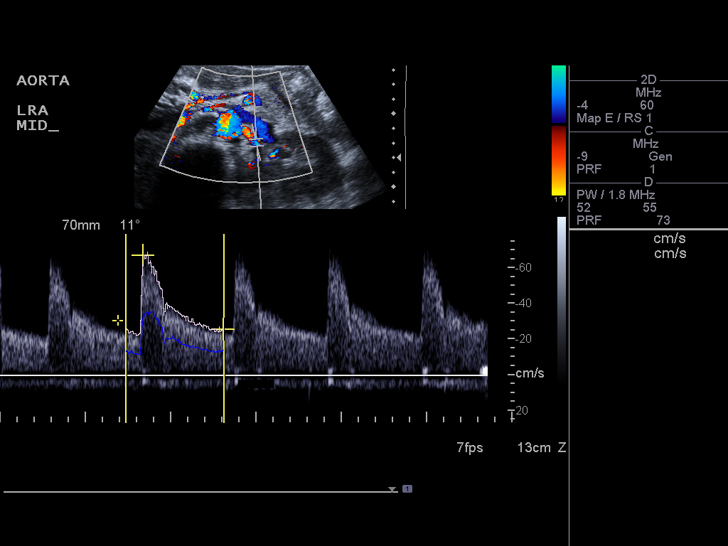
[im 51/68]
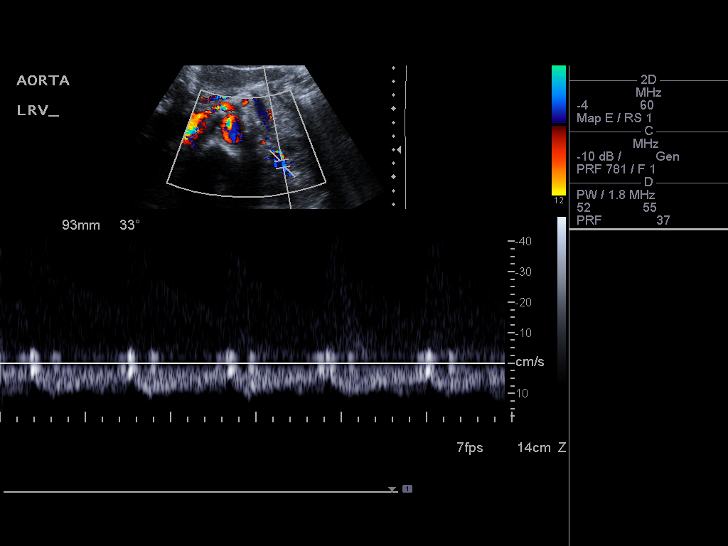
[im 56/68]
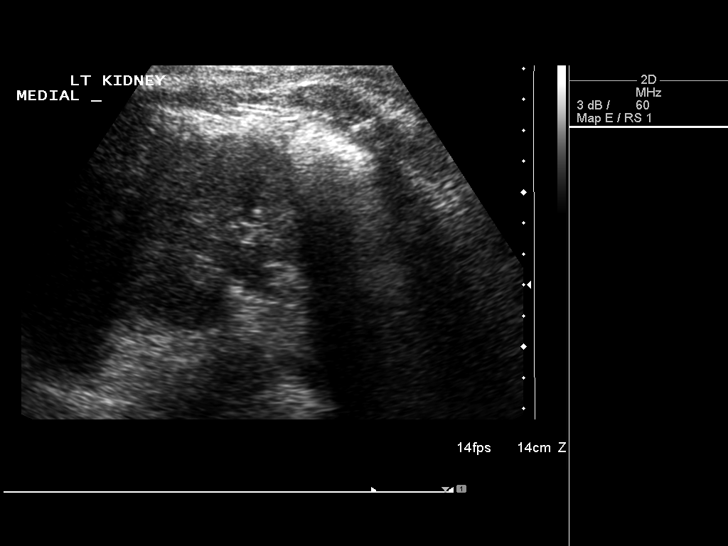
[im 62/68]
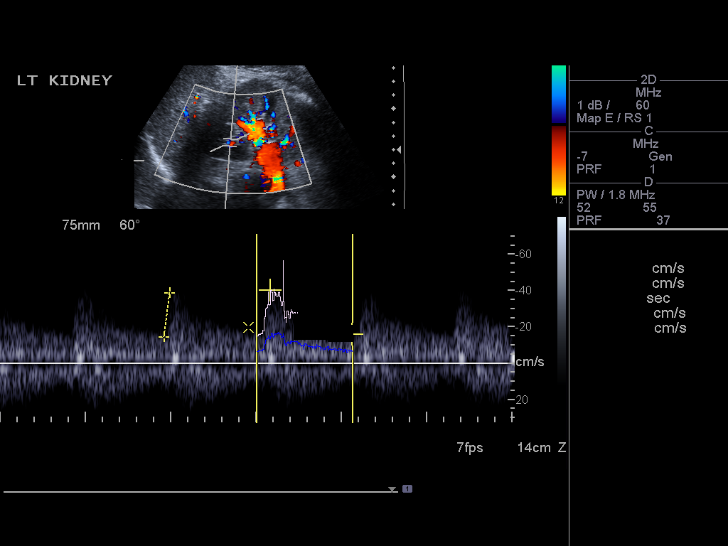
[im 68/68]
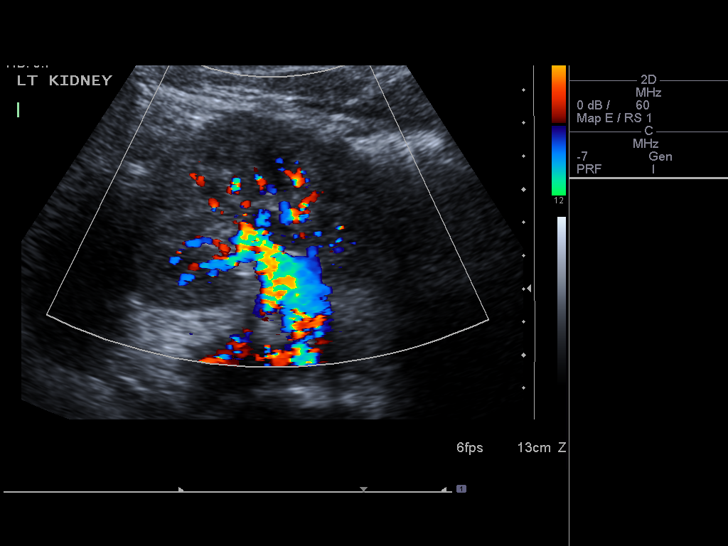

[13 of 25 positions shown; findings below may reference images not displayed]

FINDINGS: Right Kidney:  There is a normal appearance of the right kidney
measuring 10.6 cm length.  No evidence for hydronephrosis.  No
significant cortical thinning.  Renal echotexture is probably
within normal limits although it is slightly more echogenic than
the adjacent liver.

Left Kidney:  Normal appearance of left kidney measuring 10.8 cm in
length.  Negative for hydronephrosis.

Bladder:  There is fluid within the urinary bladder and no gross
abnormality.

RENAL DUPLEX ULTRASOUND

Right Renal Artery Velocities

Origin: 92 cm/sec
Mid: 101 cm/sec
Hilum: 85 cm/sec
Interlobar: 55 cm/sec
Arcuate: 37 cm/sec

Left Renal Artery Velocities

Origin: 83 cm/sec
Mid: 71 cm/sec
Hilum: 55 cm/sec
Interlobar: 45 cm/sec
Arcuate: 39 cm/sec

Aortic Velocity: 110 cm/sec

Right Renal Aortic Ratios

Origin:
Mid:

Hilum:
Interlobar:
Arcuate:

Left Renal Aortic Ratios

Origin:
Mid:
Hilum:
Interlobar:
Arcuate:0.4
FINDINGS: Mild irregularity of the abdominal aorta could represent
a small amount of plaque.  Bilateral renal arteries are patent on
color Doppler imaging.  Bilateral renal veins are patent.  Normal
velocities and waveforms in the renal arteries bilaterally.
IMPRESSION: Normal renal ultrasound.

Normal renal artery examination. No evidence for renal artery
stenosis.

## 2013-05-06 ENCOUNTER — Encounter: Payer: Self-pay | Admitting: Internal Medicine

## 2013-05-06 ENCOUNTER — Ambulatory Visit (INDEPENDENT_AMBULATORY_CARE_PROVIDER_SITE_OTHER): Payer: BC Managed Care – PPO | Admitting: Internal Medicine

## 2013-05-06 VITALS — BP 128/84 | HR 74 | Temp 98.3°F | Wt 157.8 lb

## 2013-05-06 DIAGNOSIS — R059 Cough, unspecified: Secondary | ICD-10-CM

## 2013-05-06 DIAGNOSIS — R05 Cough: Secondary | ICD-10-CM

## 2013-05-06 DIAGNOSIS — K219 Gastro-esophageal reflux disease without esophagitis: Secondary | ICD-10-CM

## 2013-05-06 DIAGNOSIS — I10 Essential (primary) hypertension: Secondary | ICD-10-CM

## 2013-05-06 MED ORDER — AZELASTINE-FLUTICASONE 137-50 MCG/ACT NA SUSP: 2.0000 | Freq: Two times a day (BID) | NASAL | Status: DC

## 2013-05-06 NOTE — Patient Instructions (Addendum)
Continue with Zyrtec Mucinex DM twice a day as needed for cough Dymista 2 puffs on each side of the nose twice a day. For acid reflux, continue with Zantac and take Protonix as needed. See Dr. Francis Dowse for blood pressure management

## 2013-05-06 NOTE — Assessment & Plan Note (Addendum)
Currently well controlled, under the care of nephrology.Also on a beta blocker, name? Patient will call w/ name . Plans to see Dr.Colodonato in 6 months

## 2013-05-06 NOTE — Assessment & Plan Note (Addendum)
Cough since she started BP medication several months ago, initially thought to be due to to ACE inhibitor   which was stopped~ 3 months but she is  still coughing. Review of systems is + for clear postnasal dripping. She also has a history GERD, recently  self switched from PPIs to H2B and cough is better Plan: dymista samples and rx provided Zantac daily, PPIs when necessary Continue with Zyrtec Mucinex DM See instructions

## 2013-05-06 NOTE — Progress Notes (Signed)
  Subjective:    Patient ID: Abigail Mathis, female    DOB: September 21, 1971, 42 y.o.   MRN: [id number]  HPI Chief complaint today is cough. Having on and off cough episodes that are severe since October when she started having problems with BP. At some point she was on lisinopril and cough thought to be from ACE inhibitors however she has been off ACE inhibitors for months and the cough persists. Few days ago she stopped her Protonix and start Zantac thinking that GERD was causing the cough and she indeed feels a little better.  Past Medical History  Diagnosis Date  . Hx of colonic polyps     3th Cscope 11-08 tics, repeat in 10 years per patient     . GERD (gastroesophageal reflux disease)   . Hypertension   . Endometriosis   . Rhinitis, allergic    Past Surgical History  Procedure Laterality Date  . G1 p1    . Abdominal hysterectomy      endometriosis  . Eye surgery      prk bilateral   History   Social History  . Marital Status: Legally Separated    Spouse Name: N/A    Number of Children: 1  . 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 Smoking status: Passive Smoke Exposure - Never Smoker  . Smokeless tobacco: Not on file  . Alcohol Use: Yes     Comment: rarely  . Drug Use: Not on file  . Sexually Active: Not on file   Other Topics Concern  . Not on file   Social History Narrative       Review of Systems Reports no classic heartburn , nausea, vomiting or diarrhea. Denies any itchy eyes or nose, no watery eyes. She has abundant clear postnasal dripping. Minimal if any clear sputum production with cough.     Objective:   Physical Exam BP 128/84  Pulse 74  Temp(Src) 98.3 F (36.8 C) (Oral)  Wt 157 lb 12.8 oz (71.578 kg)  BMI 27.07 kg/m2  SpO2 97%  General -- alert, well-developed,NAD HEENT -- TMs normal, throat w/o redness, face symmetric and not tender to palpation, nose slt congested w/o  d/c Lungs -- normal respiratory effort, no intercostal retractions, no accessory muscle use, and normal breath sounds.   Heart-- normal rate, regular rhythm, no murmur, and no gallop.    Psych-- Cognition and judgment appear intact. Alert and cooperative with normal attention span and concentration.  not anxious appearing and not depressed appearing.       Assessment & Plan:

## 2013-05-06 NOTE — Assessment & Plan Note (Signed)
Recently changed from PPIs to zantac because she was coughing frequently, cough is better. She does not have classic GERD symptoms Plan: Continue with Zantac, PPIs when necessary.

## 2013-06-18 ENCOUNTER — Other Ambulatory Visit: Payer: Self-pay

## 2013-06-18 DIAGNOSIS — Z1231 Encounter for screening mammogram for malignant neoplasm of breast: Secondary | ICD-10-CM

## 2013-07-14 ENCOUNTER — Ambulatory Visit
Admission: RE | Admit: 2013-07-14 | Discharge: 2013-07-14 | Disposition: A | Discharge status: Home / Self Care | Payer: BC Managed Care – PPO | Source: Ambulatory Visit

## 2013-07-14 DIAGNOSIS — Z1231 Encounter for screening mammogram for malignant neoplasm of breast: Secondary | ICD-10-CM

## 2014-07-14 ENCOUNTER — Ambulatory Visit (INDEPENDENT_AMBULATORY_CARE_PROVIDER_SITE_OTHER): Payer: BC Managed Care – PPO | Admitting: Internal Medicine

## 2014-07-14 ENCOUNTER — Encounter: Payer: Self-pay | Admitting: Internal Medicine

## 2014-07-14 VITALS — BP 112/62 | HR 58 | Temp 98.2°F | Wt 169.0 lb

## 2014-07-14 DIAGNOSIS — M1990 Unspecified osteoarthritis, unspecified site: Secondary | ICD-10-CM

## 2014-07-14 DIAGNOSIS — G4700 Insomnia, unspecified: Secondary | ICD-10-CM

## 2014-07-14 DIAGNOSIS — K219 Gastro-esophageal reflux disease without esophagitis: Secondary | ICD-10-CM

## 2014-07-14 DIAGNOSIS — I10 Essential (primary) hypertension: Secondary | ICD-10-CM

## 2014-07-14 HISTORY — DX: Unspecified osteoarthritis, unspecified site: M19.90

## 2014-07-14 MED ORDER — TRIAMTERENE-HCTZ 37.5-25 MG PO TABS: 1.0000 | ORAL_TABLET | Freq: Every day | ORAL | Status: DC

## 2014-07-14 MED ORDER — AMLODIPINE BESYLATE 5 MG PO TABS: ORAL_TABLET | ORAL | Status: DC

## 2014-07-14 MED ORDER — RANITIDINE HCL 75 MG PO TABS: 75.0000 mg | ORAL_TABLET | Freq: Every day | ORAL | Status: DC

## 2014-07-14 MED ORDER — CYCLOBENZAPRINE HCL 5 MG PO TABS: 5.0000 mg | ORAL_TABLET | Freq: Two times a day (BID) | ORAL | Status: DC | PRN

## 2014-07-14 MED ORDER — LORAZEPAM 0.5 MG PO TABS: 0.5000 mg | ORAL_TABLET | Freq: Every day | ORAL | Status: DC

## 2014-07-14 NOTE — Assessment & Plan Note (Signed)
States PPIs "stopped working", currently on Zantac and symptoms well-controlled, needs a prescription which is provided

## 2014-07-14 NOTE — Assessment & Plan Note (Addendum)
Has seen nephrology before, workup for secondary hypertension was negative, currently doing well. Plan: Labs RF medications, no change, return to the office in 6 months

## 2014-07-14 NOTE — Progress Notes (Signed)
Pre visit review using our clinic review tool, if applicable. No additional management support is needed unless otherwise documented below in the visit note. 

## 2014-07-14 NOTE — Progress Notes (Signed)
Subjective:    Patient ID: Abigail Mathis, female    DOB: [birth year], 43 y.o.   MRN: [id number]  DOS:  07/14/2014 Type of visit - description : routine Interval history:Since the last time she was here, she is doing well, needs a number of refills. Hypertension, used to see Dr. Abel Presto- nephrology, needs a refill on her medications. She checks her BPs sporadically  and is usually normal GERD, reports that PPIs "stopped working", currently doing well Zantac, needs a refill History of DJD, has aches and pains from time to time, Flexeril helps, needs a refill. Insomnia, well-controlled with current medications, needs a refill.    ROS Denies chest pain or difficulty breathing No nausea, vomiting, diarrhea  Past Medical History  Diagnosis Date  . Hx of colonic polyps     3th Cscope 11-08 tics, repeat in 10 years per patient     . GERD (gastroesophageal reflux disease)   . Hypertension   . Endometriosis   . Rhinitis, allergic   . DJD (degenerative joint disease) 07/14/2014    Past Surgical History  Procedure Laterality Date  . G1 p1    . Eye surgery      prk bilateral  . Pelvic laparoscopy      History   Social History  . Marital Status: Legally Separated    Spouse Name: N/A    Number of Children: 1  . 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 Smoking status: Passive Smoke Exposure - Never Smoker  . Smokeless tobacco: Never Used  . Alcohol Use: Yes     Comment: rarely  . Drug Use: Not on file  . Sexual Activity: Not on file   Other Topics Concern  . Not on file   Social History Narrative            Medication List       This list is accurate as of: 07/14/14  6:22 PM.  Always use your most recent med list.               amLODipine 5 MG tablet  Commonly known as:  NORVASC  TAKE 1 TABLET BY MOUTH DAILY     CALCIUM 600 PO  Take 1 each by mouth daily.     cyclobenzaprine 5 MG tablet    Commonly known as:  FLEXERIL  Take 1 tablet (5 mg total) by mouth 2 (two) times daily as needed for muscle spasms.     glucosamine-chondroitin 500-400 MG tablet  Take 1 tablet by mouth daily.     JOLIVETTE 0.35 MG tablet  Generic drug:  norethindrone  Take 1 tablet by mouth daily.     LORazepam 0.5 MG tablet  Commonly known as:  ATIVAN  Take 1 tablet (0.5 mg total) by mouth at bedtime.     multivitamin tablet  Take 1 tablet by mouth daily.     ranitidine 75 MG tablet  Commonly known as:  ZANTAC 75  Take 1 tablet (75 mg total) by mouth at bedtime.     triamterene-hydrochlorothiazide 37.5-25 MG per tablet  Commonly known as:  MAXZIDE-25  Take 1 tablet by mouth daily.     ZYRTEC ALLERGY PO  Take 1 tablet by mouth as needed.           Objective:   Physical Exam BP 112/62  Pulse 58  Temp(Src) 98.2 F (36.8 C) (Oral)  Wt 169  lb (76.658 kg)  SpO2 98%  General -- alert, well-developed, NAD.  Neck --no thyromegaly  HEENT-- Not pale.  Lungs -- normal respiratory effort, no intercostal retractions, no accessory muscle use, and normal breath sounds.  Heart-- normal rate, regular rhythm, no murmur.  Extremities-- no pretibial edema bilaterally  Neurologic--  alert & oriented X3. Speech normal, gait appropriate for age, strength symmetric and appropriate for age.  Psych-- Cognition and judgment appear intact. Cooperative with normal attention span and concentration. No anxious or depressed appearing.     Assessment & Plan:

## 2014-07-14 NOTE — Assessment & Plan Note (Signed)
Has aches and pains at different places, Flexeril helps, she takes it as needed, refill provided

## 2014-07-14 NOTE — Assessment & Plan Note (Signed)
On lorazepam as needed, refill provided

## 2014-07-14 NOTE — Patient Instructions (Signed)
Get your blood work before you leave      Please come back to the office in 6 months  for a routine office , fasting    Check the  blood pressure 2 or 3 times a month  be sure it is between 110/60 and 140/85. Ideal blood pressure is 120/80. If it is consistently higher or lower, let me know

## 2014-07-15 LAB — BASIC METABOLIC PANEL
BUN: 16 mg/dL (ref 6–23)
CALCIUM: 9 mg/dL (ref 8.4–10.5)
CO2: 28 mEq/L (ref 19–32)
Chloride: 100 mEq/L (ref 96–112)
Creatinine, Ser: 1.1 mg/dL (ref 0.4–1.2)
GFR: 66.75 mL/min (ref >60.00)
GLUCOSE: 89 mg/dL (ref 70–99)
Potassium: 3.5 mEq/L (ref 3.5–5.1)
Sodium: 134 mEq/L — ABNORMAL LOW (ref 135–145)

## 2014-07-15 LAB — CBC WITH DIFFERENTIAL/PLATELET
Basophils Absolute: 0 10*3/uL (ref 0.0–0.1)
Basophils Relative: 0.2 % (ref 0.0–3.0)
EOS ABS: 0.5 10*3/uL (ref 0.0–0.7)
Eosinophils Relative: 5.3 % — ABNORMAL HIGH (ref 0.0–5.0)
HCT: 41.9 % (ref 36.0–46.0)
Hemoglobin: 14.2 g/dL (ref 12.0–15.0)
LYMPHS PCT: 25.8 % (ref 12.0–46.0)
Lymphs Abs: 2.2 10*3/uL (ref 0.7–4.0)
MCHC: 33.8 g/dL (ref 30.0–36.0)
MCV: 90 fl (ref 78.0–100.0)
MONOS PCT: 10.7 % (ref 3.0–12.0)
Monocytes Absolute: 0.9 10*3/uL (ref 0.1–1.0)
Neutro Abs: 5.1 10*3/uL (ref 1.4–7.7)
Neutrophils Relative %: 58 % (ref 43.0–77.0)
Platelets: 198 10*3/uL (ref 150.0–400.0)
RBC: 4.65 Mil/uL (ref 3.87–5.11)
RDW: 13.3 % (ref 11.5–15.5)
WBC: 8.7 10*3/uL (ref 4.0–10.5)

## 2014-09-14 ENCOUNTER — Other Ambulatory Visit: Payer: Self-pay

## 2014-09-14 DIAGNOSIS — Z1231 Encounter for screening mammogram for malignant neoplasm of breast: Secondary | ICD-10-CM

## 2014-10-12 ENCOUNTER — Ambulatory Visit
Admission: RE | Admit: 2014-10-12 | Discharge: 2014-10-12 | Disposition: A | Discharge status: Home / Self Care | Payer: BC Managed Care – PPO | Source: Ambulatory Visit

## 2014-10-12 DIAGNOSIS — Z1231 Encounter for screening mammogram for malignant neoplasm of breast: Secondary | ICD-10-CM

## 2014-10-12 LAB — HM MAMMOGRAPHY: HM MAMMO: NORMAL

## 2014-10-27 ENCOUNTER — Other Ambulatory Visit: Payer: Self-pay | Admitting: Obstetrics and Gynecology

## 2015-02-23 ENCOUNTER — Ambulatory Visit (INDEPENDENT_AMBULATORY_CARE_PROVIDER_SITE_OTHER): Payer: 59 | Admitting: Internal Medicine

## 2015-02-23 ENCOUNTER — Encounter: Payer: Self-pay | Admitting: Internal Medicine

## 2015-02-23 VITALS — BP 122/84 | HR 63 | Temp 98.3°F | Ht 64.0 in | Wt 167.4 lb

## 2015-02-23 DIAGNOSIS — K219 Gastro-esophageal reflux disease without esophagitis: Secondary | ICD-10-CM | POA: Diagnosis not present

## 2015-02-23 DIAGNOSIS — I10 Essential (primary) hypertension: Secondary | ICD-10-CM

## 2015-02-23 MED ORDER — AMLODIPINE BESYLATE 5 MG PO TABS: 5.0000 mg | ORAL_TABLET | Freq: Every day | ORAL | Status: DC

## 2015-02-23 MED ORDER — RANITIDINE HCL 75 MG PO TABS: 75.0000 mg | ORAL_TABLET | Freq: Every day | ORAL | Status: DC

## 2015-02-23 MED ORDER — TRIAMTERENE-HCTZ 37.5-25 MG PO TABS: 1.0000 | ORAL_TABLET | Freq: Every day | ORAL | Status: DC

## 2015-02-23 MED ORDER — LORAZEPAM 0.5 MG PO TABS: 0.5000 mg | ORAL_TABLET | Freq: Every day | ORAL | Status: DC

## 2015-02-23 NOTE — Assessment & Plan Note (Signed)
Refill Zantac 

## 2015-02-23 NOTE — Progress Notes (Signed)
Subjective:    Patient ID: Abigail Mathis, female    DOB: 1971-10-02, 44 y.o.   MRN: [id number]  DOS:  02/23/2015 Type of visit - description : rov Interval history: Hypertension, good compliance of medication, ambulatory BPs within normal Insomnia, good compliance with Ativan, works well for her.    Review of Systems  Denies chest pain or difficulty breathing. No lower extremity edema or cramps. No anxiety depression Diet exercise: Doing okay except that lately has forgotten to eat potassium-rich foods.  Past Medical History  Diagnosis Date  . Hx of colonic polyps     3th Cscope 11-08 tics, repeat in 10 years per patient     . GERD (gastroesophageal reflux disease)   . Hypertension   . Endometriosis   . Rhinitis, allergic   . DJD (degenerative joint disease) 07/14/2014  . BV (bacterial vaginosis)     Past Surgical History  Procedure Laterality Date  . G1 p1    . Eye surgery      prk bilateral  . Pelvic laparoscopy      History   Social History  . Marital Status: Legally Separated    Spouse Name: N/A  . Number of Children: 1  . 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 Smoking status: Passive Smoke Exposure - Never Smoker  . Smokeless tobacco: Never Used  . Alcohol Use: Yes     Comment: rarely  . Drug Use: Not on file  . Sexual Activity: Not on file   Other Topics Concern  . Not on file   Social History Narrative            Medication List       This list is accurate as of: 02/23/15 11:59 PM.  Always use your most recent med list.               amLODipine 5 MG tablet  Commonly known as:  NORVASC  Take 1 tablet (5 mg total) by mouth daily.     CALCIUM 600 PO  Take 1 each by mouth daily.     cyclobenzaprine 5 MG tablet  Commonly known as:  FLEXERIL  Take 1 tablet (5 mg total) by mouth 2 (two) times daily as needed for muscle spasms.     glucosamine-chondroitin 500-400 MG tablet   Take 1 tablet by mouth daily.     levonorgestrel-ethinyl estradiol 0.1-20 MG-MCG tablet  Commonly known as:  AVIANE,ALESSE,LESSINA  Take 1 tablet by mouth daily.     LORazepam 0.5 MG tablet  Commonly known as:  ATIVAN  Take 1 tablet (0.5 mg total) by mouth at bedtime.     multivitamin tablet  Take 1 tablet by mouth daily.     ranitidine 75 MG tablet  Commonly known as:  ZANTAC 75  Take 1 tablet (75 mg total) by mouth at bedtime.     triamterene-hydrochlorothiazide 37.5-25 MG per tablet  Commonly known as:  MAXZIDE-25  Take 1 tablet by mouth daily.     ZYRTEC ALLERGY PO  Take 1 tablet by mouth as needed.           Objective:   Physical Exam BP 122/84 mmHg  Pulse 63  Temp(Src) 98.3 F (36.8 C) (Oral)  Ht  (1.626 m)  Wt 167 lb 6 oz (75.921 kg)  BMI 28.72 kg/m2  SpO2 98% General:   Well developed, well nourished . NAD.  HEENT:  Normocephalic . Face symmetric, atraumatic Lungs:  CTA B Normal respiratory effort, no intercostal retractions, no accessory muscle use. Heart: RRR,  no murmur.  Muscle skeletal: no pretibial edema bilaterally  Skin: Not pale. Not jaundice Neurologic:  alert & oriented X3.  Speech normal, gait appropriate for age and unassisted Psych--  Cognition and judgment appear intact.  Cooperative with normal attention span and concentration.  Behavior appropriate. No anxious or depressed appearing.        Assessment & Plan:

## 2015-02-23 NOTE — Progress Notes (Signed)
Pre visit review using our clinic review tool, if applicable. No additional management support is needed unless otherwise documented below in the visit note. 

## 2015-02-23 NOTE — Patient Instructions (Signed)
Get your blood work before you leave  Also get a UDS    Come back to the office in 6 months for a physical exam  Please schedule an appointment at the front desk    Come back fasting

## 2015-02-23 NOTE — Assessment & Plan Note (Signed)
Well-controlled with Ativan, refills provided, check a UDS

## 2015-02-23 NOTE — Assessment & Plan Note (Signed)
Seems well-controlled, check a BMP, refill medications, if the potassium is slightly low will insist on potassium-rich diet instead of oral supplements

## 2015-02-24 LAB — BASIC METABOLIC PANEL
BUN: 14 mg/dL (ref 6–23)
CO2: 24 meq/L (ref 19–32)
CREATININE: 1.41 mg/dL — AB (ref 0.40–1.20)
Calcium: 9.3 mg/dL (ref 8.4–10.5)
Chloride: 100 mEq/L (ref 96–112)
GFR: 52.08 mL/min — ABNORMAL LOW (ref >60.00)
Glucose, Bld: 84 mg/dL (ref 70–99)
POTASSIUM: 3.4 meq/L — AB (ref 3.5–5.1)
Sodium: 133 mEq/L — ABNORMAL LOW (ref 135–145)

## 2015-03-10 ENCOUNTER — Telehealth: Payer: Self-pay

## 2015-03-10 NOTE — Telephone Encounter (Signed)
UDS: 02/23/2015  Negative for Lorazepam   Low risk per Dr. Drue NovelPaz 03/09/2015

## 2015-09-07 ENCOUNTER — Ambulatory Visit (INDEPENDENT_AMBULATORY_CARE_PROVIDER_SITE_OTHER): Payer: 59 | Admitting: Internal Medicine

## 2015-09-07 ENCOUNTER — Encounter: Payer: Self-pay | Admitting: Internal Medicine

## 2015-09-07 VITALS — BP 124/76 | HR 66 | Temp 97.9°F | Ht 64.0 in | Wt 168.4 lb

## 2015-09-07 DIAGNOSIS — Z0182 Encounter for allergy testing: Secondary | ICD-10-CM

## 2015-09-07 DIAGNOSIS — Z09 Encounter for follow-up examination after completed treatment for conditions other than malignant neoplasm: Secondary | ICD-10-CM | POA: Insufficient documentation

## 2015-09-07 DIAGNOSIS — Z0000 Encounter for general adult medical examination without abnormal findings: Secondary | ICD-10-CM

## 2015-09-07 DIAGNOSIS — Z114 Encounter for screening for human immunodeficiency virus [HIV]: Secondary | ICD-10-CM

## 2015-09-07 LAB — LIPID PANEL
CHOLESTEROL: 173 mg/dL (ref 0–200)
HDL: 80.6 mg/dL (ref >39.00)
LDL Cholesterol: 77 mg/dL (ref 0–99)
NonHDL: 92.78
Total CHOL/HDL Ratio: 2
Triglycerides: 80 mg/dL (ref 0.0–149.0)
VLDL: 16 mg/dL (ref 0.0–40.0)

## 2015-09-07 LAB — COMPREHENSIVE METABOLIC PANEL
ALBUMIN: 4 g/dL (ref 3.5–5.2)
ALK PHOS: 36 U/L — AB (ref 39–117)
ALT: 15 U/L (ref 0–35)
AST: 22 U/L (ref 0–37)
BUN: 15 mg/dL (ref 6–23)
CO2: 28 mEq/L (ref 19–32)
Calcium: 9.4 mg/dL (ref 8.4–10.5)
Chloride: 102 mEq/L (ref 96–112)
Creatinine, Ser: 0.96 mg/dL (ref 0.40–1.20)
GFR: 80.96 mL/min (ref >60.00)
Glucose, Bld: 90 mg/dL (ref 70–99)
POTASSIUM: 4.1 meq/L (ref 3.5–5.1)
SODIUM: 138 meq/L (ref 135–145)
Total Bilirubin: 0.7 mg/dL (ref 0.2–1.2)
Total Protein: 7.4 g/dL (ref 6.0–8.3)

## 2015-09-07 LAB — TSH: TSH: 1.24 u[IU]/mL (ref 0.35–4.50)

## 2015-09-07 MED ORDER — LORAZEPAM 0.5 MG PO TABS: 0.5000 mg | ORAL_TABLET | Freq: Every day | ORAL | Status: DC

## 2015-09-07 NOTE — Progress Notes (Signed)
Pre visit review using our clinic review tool, if applicable. No additional management support is needed unless otherwise documented below in the visit note. 

## 2015-09-07 NOTE — Patient Instructions (Signed)
Get your blood work before you leave   Will refer you to an allergist  Check the  blood pressure   monthly    Be sure your blood pressure is between 110/65 and  145/85.  if it is consistently higher or lower, let me know   Next visit  for a  routine checkup in 6-8 months. No fasting. 15 minutes. Please schedule an appointment at the front desk

## 2015-09-07 NOTE — Progress Notes (Signed)
Subjective:    Patient ID: Abigail Mathis, female    DOB: 1971/02/16, 44 y.o.   MRN: [id number]  DOS:  09/07/2015 Type of visit - description : Complete physical exam Interval history: No major concerns, see review of systems    Review of Systems  Constitutional: No fever. No chills. No unexplained wt changes. No unusual sweats  HEENT: No dental problems, no ear discharge, no facial swelling, no voice changes. No eye discharge, no eye  redness , no  intolerance to light   Respiratory: No wheezing , no  difficulty breathing. No cough , no mucus production  Cardiovascular: No CP, no leg swelling , no  Palpitations  GI: no nausea, no vomiting, no diarrhea , no  abdominal pain.  No blood in the stools. No dysphagia, no odynophagia    Endocrine: No polyphagia, no polyuria , no polydipsia  GU: No dysuria, gross hematuria, difficulty urinating. No urinary urgency, no frequency.  Musculoskeletal: No joint swellings or unusual aches or pains  Skin: No change in the color of the skin, palor. Mild rash at the left arm?  Allergic, immunologic: no  food allergies. Has some mild but generalized itching on and off, day or night but is more noticeable at home despite the fact that she has change her bedding and mattress. Allergies?  Neurological: No dizziness no  syncope. No headaches. No diplopia, no slurred, no slurred speech, no motor deficits, no facial  Numbness  Hematological: No enlarged lymph nodes, no easy bruising , no unusual bleedings  Psychiatry: No suicidal ideas, no hallucinations, no beavior problems, no confusion.  No unusual/severe anxiety, no depression  Past Medical History  Diagnosis Date  . Hx of colonic polyps     3th Cscope 11-08 tics, repeat in 10 years per patient     . GERD (gastroesophageal reflux disease)   . Hypertension   . Endometriosis   . Rhinitis, allergic   . DJD (degenerative joint disease) 07/14/2014  . BV (bacterial vaginosis)     Past  Surgical History  Procedure Laterality Date  . G1 p1    . Eye surgery      prk bilateral  . Pelvic laparoscopy      Social History   Social History  . Marital Status: Legally Separated    Spouse Name: N/A  . Number of Children: 1  . 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 Smoking status: Passive Smoke Exposure - Never Smoker  . Smokeless tobacco: Never Used  . Alcohol Use: Yes     Comment: rarely  . Drug Use: Not on file  . Sexual Activity: Not on file   Other Topics Concern  . Not on file   Social History Narrative   Son lives w/ her       Family History  Problem Relation Age of Onset  . Hypertension Mother   . Hypertension Father   . Diabetes Other     ??  . Cancer Other     colon...GM  . Breast cancer Neg Hx   . CAD Neg Hx     ??  . Heart defect Father     pacemaker, CHF      Medication List       This list is accurate as of: 09/07/15  6:08 PM.  Always use your most recent med list.  amLODipine 5 MG tablet  Commonly known as:  NORVASC  Take 1 tablet (5 mg total) by mouth daily.     CALCIUM 600 PO  Take 1 each by mouth daily.     cyclobenzaprine 5 MG tablet  Commonly known as:  FLEXERIL  Take 1 tablet (5 mg total) by mouth 2 (two) times daily as needed for muscle spasms.     glucosamine-chondroitin 500-400 MG tablet  Take 1 tablet by mouth daily.     LORazepam 0.5 MG tablet  Commonly known as:  ATIVAN  Take 1 tablet (0.5 mg total) by mouth at bedtime.     multivitamin tablet  Take 1 tablet by mouth daily.     ORSYTHIA 0.1-20 MG-MCG tablet  Generic drug:  levonorgestrel-ethinyl estradiol  Take 1 tablet by mouth daily.     ranitidine 75 MG tablet  Commonly known as:  ZANTAC 75  Take 1 tablet (75 mg total) by mouth at bedtime.     triamterene-hydrochlorothiazide 37.5-25 MG tablet  Commonly known as:  MAXZIDE-25  Take 1 tablet by mouth daily.     ZYRTEC  ALLERGY PO  Take 1 tablet by mouth as needed.           Objective:   Physical Exam BP 124/76 mmHg  Pulse 66  Temp(Src) 97.9 F (36.6 C) (Oral)  Ht  (1.626 m)  Wt 168 lb 6 oz (76.374 kg)  BMI 28.89 kg/m2  SpO2 99% General:   Well developed, well nourished . NAD.  HEENT:  Normocephalic . Face symmetric, atraumatic Neck: No thyromegaly Lungs:  CTA B Normal respiratory effort, no intercostal retractions, no accessory muscle use. Heart: RRR,  no murmur.  no pretibial edema bilaterally  Abdomen:  Not distended, soft, non-tender. No rebound or rigidity. No mass,organomegaly Skin: Not pale. Not jaundice. Left tricipital area: Keratosis pilaris (mild)? Neurologic:  alert & oriented X3.  Speech normal, gait appropriate for age and unassisted Psych--  Cognition and judgment appear intact.  Cooperative with normal attention span and concentration.  Behavior appropriate. No anxious or depressed appearing.    Assessment & Plan:   Assessment> HTN GERD Insomnia on Ativan Allergic rhinitis DJD on flexeril prn Endometriosis  Plan: HTN: Well-controlled, no change Insomnia: Signing a contract today, on Ativan  allergies: Environmental? Refer to an allergist -- declined (cost), will use OTCs and call if severe symptoms. RTC 6-8 months RTC 6-8 months

## 2015-09-07 NOTE — Assessment & Plan Note (Signed)
Tdap < 10 years,declined Had a flu shot   Female care: Sees gynecology H/o colon polyps,  3th Cscope 11-08 tics, repeat in 10 years  Labs: CMP, FLP, TSH (had a very light breakfast) Diet and exercise discussed

## 2015-09-07 NOTE — Assessment & Plan Note (Signed)
HTN: Well-controlled, no change Insomnia: Signing a contract today, on Ativan  allergies: Environmental? Refer to an allergist -- declined (cost), will use OTCs and call if severe symptoms. RTC 6-8 months

## 2015-09-08 LAB — HIV ANTIBODY (ROUTINE TESTING W REFLEX): HIV: NONREACTIVE

## 2015-09-08 LAB — HM PAP SMEAR

## 2015-09-13 ENCOUNTER — Encounter: Payer: Self-pay | Admitting: Internal Medicine

## 2015-09-14 ENCOUNTER — Other Ambulatory Visit: Payer: Self-pay

## 2015-09-14 DIAGNOSIS — Z1231 Encounter for screening mammogram for malignant neoplasm of breast: Secondary | ICD-10-CM

## 2015-10-04 ENCOUNTER — Other Ambulatory Visit: Payer: Self-pay | Admitting: Internal Medicine

## 2015-10-19 ENCOUNTER — Ambulatory Visit
Admission: RE | Admit: 2015-10-19 | Discharge: 2015-10-19 | Disposition: A | Discharge status: Home / Self Care | Payer: 59 | Source: Ambulatory Visit

## 2015-10-19 DIAGNOSIS — Z1231 Encounter for screening mammogram for malignant neoplasm of breast: Secondary | ICD-10-CM

## 2015-10-27 ENCOUNTER — Other Ambulatory Visit: Payer: Self-pay | Admitting: Internal Medicine

## 2015-11-30 ENCOUNTER — Other Ambulatory Visit: Payer: Self-pay | Admitting: Internal Medicine

## 2016-03-14 ENCOUNTER — Other Ambulatory Visit: Payer: Self-pay | Admitting: Internal Medicine

## 2016-03-15 ENCOUNTER — Telehealth: Payer: Self-pay | Admitting: Internal Medicine

## 2016-03-15 NOTE — Telephone Encounter (Signed)
Dose: 75 mg Route: Oral Frequency: Daily at bedtime   Order Comments:   Requested drug refills are authorized, however, the patient needs further evaluation and/or laboratory testing before further refills are given. Ask her to make an appointment for this.        Dispense Quantity:  30 tablet Refills:  2 Fills Remaining:  2          Sig: Take 1 tablet (75 mg total) by mouth at bedtime.         Written Date:  03/14/16 Expiration Date:  03/14/17     Start Date:  03/14/16 End Date:  --     Ordering Provider:  Wanda PlumpJose E Paz, MD Authorizing Provider:  Wanda PlumpJose E Paz, MD Ordering User:  Conrad BurlingtonKaylyn Ammanda Dobbins, CMA                     [facility] [address] [facility]/14/2016 #30 tablets and 2 refills.

## 2016-03-15 NOTE — Telephone Encounter (Signed)
Pt called in to schedule and appt because she says that she need a refill on Rx. Ranitidine. Pt would like to know if provider could call her in a few until her appt?    CB: [phone]

## 2016-03-21 ENCOUNTER — Ambulatory Visit (INDEPENDENT_AMBULATORY_CARE_PROVIDER_SITE_OTHER): Payer: BLUE CROSS/BLUE SHIELD | Admitting: Internal Medicine

## 2016-03-21 ENCOUNTER — Encounter: Payer: Self-pay | Admitting: Internal Medicine

## 2016-03-21 VITALS — BP 124/68 | HR 69 | Temp 98.5°F | Ht 64.0 in | Wt 170.1 lb

## 2016-03-21 DIAGNOSIS — K219 Gastro-esophageal reflux disease without esophagitis: Secondary | ICD-10-CM

## 2016-03-21 DIAGNOSIS — G4700 Insomnia, unspecified: Secondary | ICD-10-CM | POA: Diagnosis not present

## 2016-03-21 DIAGNOSIS — Z79899 Other long term (current) drug therapy: Secondary | ICD-10-CM | POA: Diagnosis not present

## 2016-03-21 DIAGNOSIS — I10 Essential (primary) hypertension: Secondary | ICD-10-CM

## 2016-03-21 MED ORDER — AMLODIPINE BESYLATE 5 MG PO TABS: 5.0000 mg | ORAL_TABLET | Freq: Every day | ORAL | Status: DC

## 2016-03-21 MED ORDER — TRIAMTERENE-HCTZ 37.5-25 MG PO TABS: 1.0000 | ORAL_TABLET | Freq: Every day | ORAL | Status: DC

## 2016-03-21 MED ORDER — RANITIDINE HCL 75 MG PO TABS: 75.0000 mg | ORAL_TABLET | Freq: Every day | ORAL | Status: DC

## 2016-03-21 NOTE — Patient Instructions (Signed)
GO TO THE LAB : provide a urine sample for a UDS     GO TO THE FRONT DESK Schedule your next appointment for a  Physical exam in 5 or 6 months , fasting

## 2016-03-21 NOTE — Progress Notes (Signed)
Subjective:    Patient ID: Abigail Mathis, female    DOB: 15-Aug-1971, 45 y.o.   MRN: [id number]  DOS:  03/21/2016 Type of visit - description : Routine visit Interval history: HTN: Good compliance with meds, ambulatory BPs normal, wonders if she could decrease or stop the diuretics. Insomnia: Well-controlled, Ativan as needed Stopped calcium, wonders if that is okay.   Review of Systems Denies chest pain or difficulty breathing No nausea, vomiting, diarrhea  Past Medical History  Diagnosis Date  . Hx of colonic polyps     3th Cscope 11-08 tics, repeat in 10 years per patient  . GERD (gastroesophageal reflux disease)   . Hypertension   . Endometriosis   . Rhinitis, allergic   . DJD (degenerative joint disease) 07/14/2014  . BV (bacterial vaginosis)     Past Surgical History  Procedure Laterality Date  . Cryotherapy      Cervix  . Eye surgery      prk bilateral  . Pelvic laparoscopy    . Cesarean section      Social History   Social History  . Marital Status: Legally Separated    Spouse Name: N/A  . Number of Children: 1  . 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 Smoking status: Passive Smoke Exposure - Never Smoker  . Smokeless tobacco: Never Used  . Alcohol Use: Yes     Comment: rarely  . Drug Use: Not on file  . Sexual Activity: Not on file   Other Topics Concern  . Not on file   Social History Narrative   Son lives w/ her          Medication List       This list is accurate as of: 03/21/16  5:25 PM.  Always use your most recent med list.               amLODipine 5 MG tablet  Commonly known as:  NORVASC  Take 1 tablet (5 mg total) by mouth daily.     aspirin 81 MG tablet  Take 81 mg by mouth daily.     glucosamine-chondroitin 500-400 MG tablet  Take 1 tablet by mouth daily.     LORazepam 0.5 MG tablet  Commonly known as:  ATIVAN  Take 1 tablet (0.5 mg total) by mouth at  bedtime.     multivitamin tablet  Take 1 tablet by mouth daily.     ORSYTHIA 0.1-20 MG-MCG tablet  Generic drug:  levonorgestrel-ethinyl estradiol  Take 1 tablet by mouth daily.     ranitidine 75 MG tablet  Commonly known as:  ZANTAC  Take 1 tablet (75 mg total) by mouth at bedtime.     triamterene-hydrochlorothiazide 37.5-25 MG tablet  Commonly known as:  MAXZIDE-25  Take 1 tablet by mouth daily.     ZYRTEC ALLERGY PO  Take 1 tablet by mouth as needed.           Objective:   Physical Exam BP 124/68 mmHg  Pulse 69  Temp(Src) 98.5 F (36.9 C) (Oral)  Ht 5\' 4"  (1.626 m)  Wt 170 lb 2 oz (77.168 kg)  BMI 29.19 kg/m2  SpO2 97%   General:   Well developed, well nourished . NAD.  HEENT:  Normocephalic . Face symmetric, atraumatic Lungs:  CTA B Normal respiratory effort, no intercostal retractions, no accessory muscle use. Heart: RRR,  no murmur.  No pretibial edema bilaterally  Skin: Not pale. Not jaundice Neurologic:  alert & oriented X3.  Speech normal, gait appropriate for age and unassisted Psych--  Cognition and judgment appear intact.  Cooperative with normal attention span and concentration.  Behavior appropriate. No anxious or depressed appearing.       Assessment & Plan:   Assessment> HTN GERD Insomnia on Ativan Allergic rhinitis DJD on flexeril prn Endometriosis  Plan: HTN: Good compliance with amlodipine and Maxzide, wonders if she could decrease the diuretic, I'm not opposed as long as she continue checking her BP. At this point she declined any changes. Insomnia: Ativan as needed only, check a UDS GERD: Refill Zantac Patient is stopped calcium supplements, that is okay as long as she has a healthy diet. Also wonders about vitamin D supplements, currently on MVI. Vitamin D was normal before --> cont MVI  RTC, CPX 5-6 months

## 2016-03-21 NOTE — Progress Notes (Signed)
Pre visit review using our clinic review tool, if applicable. No additional management support is needed unless otherwise documented below in the visit note. 

## 2016-03-21 NOTE — Assessment & Plan Note (Signed)
HTN: Good compliance with amlodipine and Maxzide, wonders if she could decrease the diuretic, I'm not opposed as long as she continue checking her BP. At this point she declined any changes. Insomnia: Ativan as needed only, check a UDS GERD: Refill Zantac Patient is stopped calcium supplements, that is okay as long as she has a healthy diet. Also wonders about vitamin D supplements, currently on MVI. Vitamin D was normal before --> cont MVI  RTC, CPX 5-6 months

## 2016-04-27 ENCOUNTER — Telehealth: Payer: Self-pay

## 2016-04-27 NOTE — Telephone Encounter (Signed)
UDS: 03/21/2016  Negative for Lorazepam: PRN   Low risk per Dr. Drue NovelPaz 04/27/2016

## 2016-05-09 ENCOUNTER — Encounter: Payer: Self-pay | Admitting: Internal Medicine

## 2016-05-16 ENCOUNTER — Telehealth: Payer: Self-pay

## 2016-05-16 MED ORDER — LORAZEPAM 0.5 MG PO TABS: 0.5000 mg | ORAL_TABLET | Freq: Every day | ORAL | Status: DC

## 2016-05-16 NOTE — Telephone Encounter (Signed)
#  30 and 4 refills

## 2016-05-16 NOTE — Telephone Encounter (Signed)
Rx faxed to [facility] pharmacy.  

## 2016-05-16 NOTE — Telephone Encounter (Signed)
Pt is requesting refill on Lorazepam.  Last OV: 03/21/2016 Last Fill: 09/07/2015 #30 and 4RF UDS: 03/21/2016 Low risk  Please advise.

## 2016-05-16 NOTE — Telephone Encounter (Signed)
Rx printed, awaiting MD signature.  

## 2016-06-29 DIAGNOSIS — Z713 Dietary counseling and surveillance: Secondary | ICD-10-CM | POA: Diagnosis not present

## 2016-08-01 DIAGNOSIS — Z713 Dietary counseling and surveillance: Secondary | ICD-10-CM | POA: Diagnosis not present

## 2016-08-18 ENCOUNTER — Other Ambulatory Visit: Payer: Self-pay | Admitting: Internal Medicine

## 2016-08-24 ENCOUNTER — Other Ambulatory Visit: Payer: Self-pay | Admitting: Internal Medicine

## 2016-09-06 ENCOUNTER — Encounter: Payer: BLUE CROSS/BLUE SHIELD | Admitting: Internal Medicine

## 2016-09-15 ENCOUNTER — Ambulatory Visit: Payer: BLUE CROSS/BLUE SHIELD | Admitting: Internal Medicine

## 2016-10-04 DIAGNOSIS — N898 Other specified noninflammatory disorders of vagina: Secondary | ICD-10-CM | POA: Diagnosis not present

## 2016-10-04 DIAGNOSIS — Z01419 Encounter for gynecological examination (general) (routine) without abnormal findings: Secondary | ICD-10-CM | POA: Diagnosis not present

## 2016-10-04 DIAGNOSIS — Z3040 Encounter for surveillance of contraceptives, unspecified: Secondary | ICD-10-CM | POA: Diagnosis not present

## 2016-10-04 DIAGNOSIS — N926 Irregular menstruation, unspecified: Secondary | ICD-10-CM | POA: Diagnosis not present

## 2016-10-04 LAB — HM PAP SMEAR

## 2016-10-09 ENCOUNTER — Encounter: Payer: Self-pay | Admitting: Internal Medicine

## 2016-10-25 DIAGNOSIS — Z1231 Encounter for screening mammogram for malignant neoplasm of breast: Secondary | ICD-10-CM | POA: Diagnosis not present

## 2016-11-07 ENCOUNTER — Encounter: Payer: Self-pay | Admitting: Internal Medicine

## 2016-11-07 ENCOUNTER — Ambulatory Visit (INDEPENDENT_AMBULATORY_CARE_PROVIDER_SITE_OTHER): Payer: BLUE CROSS/BLUE SHIELD | Admitting: Internal Medicine

## 2016-11-07 VITALS — BP 118/68 | HR 67 | Temp 98.2°F | Resp 14 | Ht 64.0 in | Wt 175.2 lb

## 2016-11-07 DIAGNOSIS — Z23 Encounter for immunization: Secondary | ICD-10-CM

## 2016-11-07 DIAGNOSIS — Z0000 Encounter for general adult medical examination without abnormal findings: Secondary | ICD-10-CM

## 2016-11-07 LAB — CBC WITH DIFFERENTIAL/PLATELET
Basophils Absolute: 0.1 10*3/uL (ref 0.0–0.1)
Basophils Relative: 0.6 % (ref 0.0–3.0)
Eosinophils Absolute: 0.1 10*3/uL (ref 0.0–0.7)
Eosinophils Relative: 1.3 % (ref 0.0–5.0)
HCT: 40.4 % (ref 36.0–46.0)
Hemoglobin: 13.9 g/dL (ref 12.0–15.0)
LYMPHS ABS: 2.1 10*3/uL (ref 0.7–4.0)
Lymphocytes Relative: 24.5 % (ref 12.0–46.0)
MCHC: 34.3 g/dL (ref 30.0–36.0)
MCV: 86.9 fl (ref 78.0–100.0)
MONOS PCT: 8.8 % (ref 3.0–12.0)
Monocytes Absolute: 0.7 10*3/uL (ref 0.1–1.0)
NEUTROS ABS: 5.5 10*3/uL (ref 1.4–7.7)
NEUTROS PCT: 64.8 % (ref 43.0–77.0)
Platelets: 283 10*3/uL (ref 150.0–400.0)
RBC: 4.65 Mil/uL (ref 3.87–5.11)
RDW: 13.3 % (ref 11.5–15.5)
WBC: 8.5 10*3/uL (ref 4.0–10.5)

## 2016-11-07 LAB — BASIC METABOLIC PANEL
BUN: 16 mg/dL (ref 6–23)
CALCIUM: 9.1 mg/dL (ref 8.4–10.5)
CO2: 26 mEq/L (ref 19–32)
Chloride: 100 mEq/L (ref 96–112)
Creatinine, Ser: 0.84 mg/dL (ref 0.40–1.20)
GFR: 93.96 mL/min (ref >60.00)
Glucose, Bld: 96 mg/dL (ref 70–99)
Potassium: 3.6 mEq/L (ref 3.5–5.1)
SODIUM: 133 meq/L — AB (ref 135–145)

## 2016-11-07 LAB — LIPID PANEL
Cholesterol: 174 mg/dL (ref 0–200)
HDL: 94.4 mg/dL (ref >39.00)
LDL Cholesterol: 61 mg/dL (ref 0–99)
NONHDL: 79.21
Total CHOL/HDL Ratio: 2
Triglycerides: 93 mg/dL (ref 0.0–149.0)
VLDL: 18.6 mg/dL (ref 0.0–40.0)

## 2016-11-07 MED ORDER — AMLODIPINE BESYLATE 5 MG PO TABS: 5.0000 mg | ORAL_TABLET | Freq: Every day | ORAL | 3 refills | Status: DC

## 2016-11-07 MED ORDER — RANITIDINE HCL 75 MG PO TABS: 75.0000 mg | ORAL_TABLET | Freq: Every day | ORAL | 3 refills | Status: DC

## 2016-11-07 MED ORDER — TRIAMTERENE-HCTZ 37.5-25 MG PO TABS: 1.0000 | ORAL_TABLET | Freq: Every day | ORAL | 3 refills | Status: DC

## 2016-11-07 MED ORDER — CYCLOBENZAPRINE HCL 5 MG PO TABS: 5.0000 mg | ORAL_TABLET | Freq: Every evening | ORAL | 5 refills | Status: DC | PRN

## 2016-11-07 MED ORDER — LORAZEPAM 0.5 MG PO TABS: 0.5000 mg | ORAL_TABLET | Freq: Every day | ORAL | 5 refills | Status: DC

## 2016-11-07 NOTE — Progress Notes (Signed)
Lorazepam Rx faxed to [facility] pharmacy.  

## 2016-11-07 NOTE — Progress Notes (Signed)
Subjective:    Patient ID: Abigail Mathis, female    DOB: 05/25/1971, 46 y.o.   MRN: [id number]  DOS:  11/07/2016 Type of visit - description : CPX Interval history:  No major concerns, feeling well  Review of Systems Constitutional: No fever. No chills. No unexplained wt changes. No unusual sweats  HEENT: No dental problems, no ear discharge, no facial swelling, no voice changes. No eye discharge, no eye  redness , no  intolerance to light   Respiratory: No wheezing , no  difficulty breathing. No cough , no mucus production  Cardiovascular: No CP, no leg swelling , no  Palpitations  GI: no nausea, no vomiting, no diarrhea , no  abdominal pain.  No blood in the stools. No dysphagia, no odynophagia    Endocrine: No polyphagia, no polyuria , no polydipsia  GU: No dysuria, gross hematuria, difficulty urinating. No urinary urgency, no frequency.  Musculoskeletal: No joint swellings or unusual aches or pains  Skin: No change in the color of the skin, palor , no  Rash  Allergic, immunologic: No environmental allergies , no  food allergies  Neurological: No dizziness no  syncope. No headaches. No diplopia, no slurred, no slurred speech, no motor deficits, no facial  Numbness  Hematological: No enlarged lymph nodes, no easy bruising , no unusual bleedings  Psychiatry: No suicidal ideas, no hallucinations, no beavior problems, no confusion.  No unusual/severe anxiety, no depression   Past Medical History:  Diagnosis Date  . BV (bacterial vaginosis)   . DJD (degenerative joint disease) 07/14/2014  . Endometriosis   . GERD (gastroesophageal reflux disease)   . Hx of colonic polyps    3th Cscope 11-08 tics, repeat in 10 years per patient  . Hypertension   . Rhinitis, allergic     Past Surgical History:  Procedure Laterality Date  . CESAREAN SECTION    . CRYOTHERAPY     Cervix  . EYE SURGERY     prk bilateral  . PELVIC LAPAROSCOPY      Social History   Social  History  . Marital status: Legally Separated    Spouse name: N/A  . Number of children: 1  . Years of education: N/A   Occupational History  . social worker     hospice    Social History Main Topics  . Smoking status: Passive Smoke Exposure - Never Smoker  . Smokeless tobacco: Never Used  . Alcohol use Yes     Comment: rarely  . Drug use: Unknown  . Sexual activity: Not on file   Other Topics Concern  . Not on file   Social History Narrative   Son lives w/ her       Family History  Problem Relation Age of Onset  . Hypertension Mother   . Hypertension Father   . Heart defect Father     pacemaker, CHF  . Diabetes Other     ??  . Cancer Other     colon...GM  . Breast cancer Neg Hx   . CAD Neg Hx     ??     Allergies as of 11/07/2016   No Known Allergies     Medication List       Accurate as of 11/07/16  5:01 PM. Always use your most recent med list.          acetaminophen 500 MG tablet Commonly known as:  TYLENOL Take 500 mg by mouth every 6 (six) hours as needed.  amLODipine 5 MG tablet Commonly known as:  NORVASC Take 1 tablet (5 mg total) by mouth daily.   cyclobenzaprine 5 MG tablet Commonly known as:  FLEXERIL Take 1 tablet (5 mg total) by mouth at bedtime as needed for muscle spasms.   glucosamine-chondroitin 500-400 MG tablet Take 1 tablet by mouth daily.   LORazepam 0.5 MG tablet Commonly known as:  ATIVAN Take 1 tablet (0.5 mg total) by mouth at bedtime.   multivitamin tablet Take 1 tablet by mouth daily.   ranitidine 75 MG tablet Commonly known as:  ZANTAC Take 1 tablet (75 mg total) by mouth at bedtime.   triamterene-hydrochlorothiazide 37.5-25 MG tablet Commonly known as:  MAXZIDE-25 Take 1 tablet by mouth daily.   VYFEMLA 0.4-35 MG-MCG tablet Generic drug:  norethindrone-ethinyl estradiol TAKE ACTIVE TABLETS BY MOUTH ONCE DAILY CONTINUOUSLY. TAKE PLACEBO ONLY AT THE END OF FOURTH PACKET   ZYRTEC ALLERGY PO Take 1 tablet by  mouth as needed.          Objective:   Physical Exam BP 118/68 (BP Location: Left Arm, Patient Position: Sitting, Cuff Size: Normal)   Pulse 67   Temp 98.2 F (36.8 C) (Oral)   Resp 14   Ht 5\' 4"  (1.626 m)   Wt 175 lb 4 oz (79.5 kg)   SpO2 97%   BMI 30.08 kg/m   General:   Well developed, well nourished . NAD.  Neck: No  thyromegaly  HEENT:  Normocephalic . Face symmetric, atraumatic Lungs:  CTA B Normal respiratory effort, no intercostal retractions, no accessory muscle use. Heart: RRR,  no murmur.  No pretibial edema bilaterally  Abdomen:  Not distended, soft, non-tender. No rebound or rigidity.   Skin: Exposed areas without rash. Not pale. Not jaundice Neurologic:  alert & oriented X3.  Speech normal, gait appropriate for age and unassisted Strength symmetric and appropriate for age.  Psych: Cognition and judgment appear intact.  Cooperative with normal attention span and concentration.  Behavior appropriate. No anxious or depressed appearing.    Assessment & Plan:  Assessment> HTN GERD Insomnia on Ativan Allergic rhinitis DJD on flexeril prn Endometriosis  PLAN: HTN: Continue amlodipine, Maxzide. Checking labs GERD: Refill Zantac. Asx Insomnia: Refill Ativan today and prn through out this year DJD: Patient takes Flexeril, only  5 mg as needed RTC one year

## 2016-11-07 NOTE — Patient Instructions (Signed)
GO TO THE LAB : Get the blood work     GO TO THE FRONT DESK Schedule your next appointment for a  physical exam in one year, fasting    Check the  blood pressure once a month Be sure your blood pressure is between 110/65 and  135/85. If it is consistently higher or lower, let me know  Call for refills when needed

## 2016-11-07 NOTE — Assessment & Plan Note (Signed)
HTN: Continue amlodipine, Maxzide. Checking labs GERD: Refill Zantac. Asx Insomnia: Refill Ativan today and prn through out this year DJD: Patient takes Flexeril, only  5 mg as needed RTC one year

## 2016-11-07 NOTE — Assessment & Plan Note (Signed)
Tdap 10-2016 Had a flu shot (no documentation)  Female care: Sees gynecology, last mammogram few weeks ago. H/o colon polyps,  3th Cscope 11-08 tics, repeat in 10 years . Patient aware, will call if she does not get a letter from GI within few months Labs:  BMP, FLP, CBC Diet and exercise discussed (she is actually doing well)

## 2016-11-07 NOTE — Progress Notes (Signed)
Pre visit review using our clinic review tool, if applicable. No additional management support is needed unless otherwise documented below in the visit note. 

## 2017-05-07 ENCOUNTER — Other Ambulatory Visit: Payer: Self-pay | Admitting: Internal Medicine

## 2017-05-08 ENCOUNTER — Telehealth: Payer: Self-pay | Admitting: Internal Medicine

## 2017-05-08 MED ORDER — RANITIDINE HCL 75 MG PO TABS: 75.0000 mg | ORAL_TABLET | Freq: Every day | ORAL | 3 refills | Status: DC

## 2017-05-08 NOTE — Telephone Encounter (Signed)
Rx sent 

## 2017-05-08 NOTE — Telephone Encounter (Signed)
Relation to NW:GNFApt:self Call back [phone] [id number] [place] [address] [facility] [address] [phone] (Phone) [phone] (Fax)     Reason for call:  Patient requesting a refill acid reflux medication

## 2017-05-15 ENCOUNTER — Other Ambulatory Visit: Payer: Self-pay | Admitting: Internal Medicine

## 2017-05-16 NOTE — Telephone Encounter (Signed)
Ok 30 and 5 

## 2017-05-16 NOTE — Telephone Encounter (Signed)
Rx faxed to [facility] pharmacy.  

## 2017-05-16 NOTE — Telephone Encounter (Signed)
Rx printed, awaiting MD signature.  

## 2017-05-16 NOTE — Telephone Encounter (Signed)
Pt is requesting refill on lorazepam.  Last OV: 11/07/2016, CPE scheduled 11/12/2017 Last Fill: 11/07/2016 #30 and 5RF UDS: 03/21/2016 Low risk  [place] Cont[place] Sub[place] Database printed; no issues noted.  Please advise.

## 2017-07-18 ENCOUNTER — Encounter: Payer: Self-pay | Admitting: Gastroenterology

## 2017-10-10 ENCOUNTER — Other Ambulatory Visit: Payer: Self-pay

## 2017-10-11 ENCOUNTER — Ambulatory Visit: Payer: BLUE CROSS/BLUE SHIELD | Admitting: Internal Medicine

## 2017-10-11 ENCOUNTER — Ambulatory Visit (INDEPENDENT_AMBULATORY_CARE_PROVIDER_SITE_OTHER): Payer: BLUE CROSS/BLUE SHIELD | Admitting: Internal Medicine

## 2017-10-11 ENCOUNTER — Encounter: Payer: Self-pay | Admitting: Internal Medicine

## 2017-10-11 VITALS — BP 116/74 | HR 74 | Temp 98.1°F | Resp 14 | Ht 64.0 in | Wt 181.4 lb

## 2017-10-11 DIAGNOSIS — G4700 Insomnia, unspecified: Secondary | ICD-10-CM | POA: Diagnosis not present

## 2017-10-11 DIAGNOSIS — M545 Low back pain, unspecified: Secondary | ICD-10-CM

## 2017-10-11 DIAGNOSIS — Z79899 Other long term (current) drug therapy: Secondary | ICD-10-CM

## 2017-10-11 MED ORDER — PREDNISONE 10 MG PO TABS: ORAL_TABLET | ORAL | 0 refills | Status: DC

## 2017-10-11 NOTE — Patient Instructions (Addendum)
GO TO THE LAB : provide a urine sample  For back pain treatment: Prednisone for a few days Tylenol is okay as needed Naproxen OTC 1 tablet twice a day sporadically is safe. flexeril Warm compresses Call if not improving  For back pain prevention: Physical therapy should help you keep your back stretched and stronger. Consider doing self physical therapy daily,  see below, Consider see  One of our physical therapist at the [facility].    [facility] Website with information about home physical therapy for back pain: XULive.tnhttp://www.[facility].org/healthy-living/adult-health/multimedia/back-pain/sls-20076265?s=1     Back Exercises If you have pain in your back, do these exercises 2-3 times each day or as told by your doctor. When the pain goes away, do the exercises once each day, but repeat the steps more times for each exercise (do more repetitions). If you do not have pain in your back, do these exercises once each day or as told by your doctor. Exercises Single Knee to Chest  Do these steps 3-5 times in a row for each leg: 1. Lie on your back on a firm bed or the floor with your legs stretched out. 2. Bring one knee to your chest. 3. Hold your knee to your chest by grabbing your knee or thigh. 4. Pull on your knee until you feel a gentle stretch in your lower back. 5. Keep doing the stretch for 10-30 seconds. 6. Slowly let go of your leg and straighten it.  Pelvic Tilt  Do these steps 5-10 times in a row: 1. Lie on your back on a firm bed or the floor with your legs stretched out. 2. Bend your knees so they point up to the ceiling. Your feet should be flat on the floor. 3. Tighten your lower belly (abdomen) muscles to press your lower back against the floor. This will make your tailbone point up to the ceiling instead of pointing down to your feet or the floor. 4. Stay in this position for 5-10 seconds while you gently tighten your muscles and breathe  evenly.  Cat-Cow  Do these steps until your lower back bends more easily: 1. Get on your hands and knees on a firm surface. Keep your hands under your shoulders, and keep your knees under your hips. You may put padding under your knees. 2. Let your head hang down, and make your tailbone point down to the floor so your lower back is round like the back of a cat. 3. Stay in this position for 5 seconds. 4. Slowly lift your head and make your tailbone point up to the ceiling so your back hangs low (sags) like the back of a cow. 5. Stay in this position for 5 seconds.  Press-Ups  Do these steps 5-10 times in a row: 1. Lie on your belly (face-down) on the floor. 2. Place your hands near your head, about shoulder-width apart. 3. While you keep your back relaxed and keep your hips on the floor, slowly straighten your arms to raise the top half of your body and lift your shoulders. Do not use your back muscles. To make yourself more comfortable, you may change where you place your hands. 4. Stay in this position for 5 seconds. 5. Slowly return to lying flat on the floor.  Bridges  Do these steps 10 times in a row: 1. Lie on your back on a firm surface. 2. Bend your knees so they point up to the ceiling. Your feet should be flat on  the floor. 3. Tighten your butt muscles and lift your butt off of the floor until your waist is almost as high as your knees. If you do not feel the muscles working in your butt and the back of your thighs, slide your feet 1-2 inches farther away from your butt. 4. Stay in this position for 3-5 seconds. 5. Slowly lower your butt to the floor, and let your butt muscles relax.  If this exercise is too easy, try doing it with your arms crossed over your chest. Belly Crunches  Do these steps 5-10 times in a row: 1. Lie on your back on a firm bed or the floor with your legs stretched out. 2. Bend your knees so they point up to the ceiling. Your feet should be flat on  the floor. 3. Cross your arms over your chest. 4. Tip your chin a little bit toward your chest but do not bend your neck. 5. Tighten your belly muscles and slowly raise your chest just enough to lift your shoulder blades a tiny bit off of the floor. 6. Slowly lower your chest and your head to the floor.  Back Lifts Do these steps 5-10 times in a row: 1. Lie on your belly (face-down) with your arms at your sides, and rest your forehead on the floor. 2. Tighten the muscles in your legs and your butt. 3. Slowly lift your chest off of the floor while you keep your hips on the floor. Keep the back of your head in line with the curve in your back. Look at the floor while you do this. 4. Stay in this position for 3-5 seconds. 5. Slowly lower your chest and your face to the floor.  Contact a doctor if:  Your back pain gets a lot worse when you do an exercise.  Your back pain does not lessen 2 hours after you exercise. If you have any of these problems, stop doing the exercises. Do not do them again unless your doctor says it is okay. Get help right away if:  You have sudden, very bad back pain. If this happens, stop doing the exercises. Do not do them again unless your doctor says it is okay. This information is not intended to replace advice given to you by your health care provider. Make sure you discuss any questions you have with your health care provider. Document Released: 11/18/2010 Document Revised: 03/23/2016 Document Reviewed: 12/10/2014 Elsevier Interactive Patient Education  [employer]2018 Elsevier Inc.

## 2017-10-11 NOTE — Progress Notes (Signed)
Subjective:    Patient ID: Abigail Mathis, female    DOB: 14-Oct-1971, 46 y.o.   MRN: [id number]  DOS:  10/11/2017 Type of visit - description : acute Interval history: Has mild back pain on and off for a while, once a year get a severe episode.  This time started a few days ago, pain is located at the lower back bilaterally, worse on the right. No radiation. Back feel stiff and is hard for her to bend over or move around. Overall current episode is improving with Tylenol, naproxen OTC and a muscle relaxant.  Concerned about safety of naproxen.   Review of Systems  Denies fever chills No fall or injury No lower extremity paresthesias No rash Past Medical History:  Diagnosis Date  . BV (bacterial vaginosis)   . DJD (degenerative joint disease) 07/14/2014  . Endometriosis   . GERD (gastroesophageal reflux disease)   . Hx of colonic polyps    3th Cscope 11-08 tics, repeat in 10 years per patient  . Hypertension   . Rhinitis, allergic     Past Surgical History:  Procedure Laterality Date  . CESAREAN SECTION    . CRYOTHERAPY     Cervix  . EYE SURGERY     prk bilateral  . PELVIC LAPAROSCOPY      Social History   Socioeconomic History  . Marital status: Legally Separated    Spouse name: Not on file  . Number of children: 1  . Years of education: Not on file  . Highest education level: Not on file  Social Needs  . Financial resource strain: Not on file  . Food insecurity - worry: Not on file  . Food insecurity - inability: Not on file  . Transportation needs - medical: Not on file  . Transportation needs - non-medical: Not on file  Occupational History  . 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 Smoking status: Passive Smoke Exposure - Never Smoker  . Smokeless tobacco: Never Used  Substance and Sexual Activity  . Alcohol use: Yes    Comment: rarely  . Drug use: Not on file  . Sexual activity: Not on file  Other Topics Concern  . Not  on file  Social History Narrative   Son lives w/ her        Allergies as of 10/11/2017   No Known Allergies     Medication List        Accurate as of 10/11/17  5:03 PM. Always use your most recent med list.          acetaminophen 500 MG tablet Commonly known as:  TYLENOL Take 500 mg by mouth every 6 (six) hours as needed.   amLODipine 5 MG tablet Commonly known as:  NORVASC Take 1 tablet (5 mg total) by mouth daily.   cyclobenzaprine 5 MG tablet Commonly known as:  FLEXERIL Take 1 tablet (5 mg total) by mouth at bedtime as needed for muscle spasms.   glucosamine-chondroitin 500-400 MG tablet Take 1 tablet by mouth daily.   LORazepam 0.5 MG tablet Commonly known as:  ATIVAN Take 1 tablet (0.5 mg total) by mouth at bedtime.   multivitamin tablet Take 1 tablet by mouth daily.   predniSONE 10 MG tablet Commonly known as:  DELTASONE 3 tabs x 2 days, 2 tabs x 2 days, 1 tab x 2 days   ranitidine 75 MG tablet Commonly known as:  ZANTAC Take 1 tablet (75  mg total) by mouth at bedtime.   triamterene-hydrochlorothiazide 37.5-25 MG tablet Commonly known as:  MAXZIDE-25 Take 1 tablet by mouth daily.   VYFEMLA 0.4-35 MG-MCG tablet Generic drug:  norethindrone-ethinyl estradiol TAKE ACTIVE TABLETS BY MOUTH ONCE DAILY CONTINUOUSLY. TAKE PLACEBO ONLY AT THE END OF FOURTH PACKET   ZYRTEC ALLERGY PO Take 1 tablet by mouth as needed.          Objective:   Physical Exam BP 116/74 (BP Location: Left Arm, Patient Position: Sitting, Cuff Size: Small)   Pulse 74   Temp 98.1 F (36.7 C) (Oral)   Resp 14   Ht 5\' 4"  (1.626 m)   Wt 181 lb 6 oz (82.3 kg)   SpO2 97%   BMI 31.13 kg/m   General:   Well developed, well nourished . NAD.  HEENT:  Normocephalic . Face symmetric, atraumatic MSK: No TTP at the lumbar spine Abdomen:  Not distended, soft, non-tender. No rebound or rigidity.   Skin: Not pale. Not jaundice Neurologic:  alert & oriented X3.  Speech normal,  gait appropriate for age and unassisted.  Motor and DTR symmetric.  Straight leg test normal. Psych--  Cognition and judgment appear intact.  Cooperative with normal attention span and concentration.  Behavior appropriate. No anxious or depressed appearing.     Assessment & Plan:   Assessment HTN GERD Insomnia on Ativan Allergic rhinitis DJD on flexeril prn Endometriosis  PLAN: Lumbalgia: Has on and off mild sxs with episodic more intense exacerbations, no more often than once a year.  Patient is concerned, wonders about a MRI, epidural injections etc.  (Both of her sisters require such interventions). Recommend patient conservative treatment, no red flag symptoms.  Extensive discussion about  self PT vs  formal PT. We agreed on use Tylenol, Flexeril, occasional naproxen, self physical therapy.  Call if not improving.  See AVS. Insomnia: On Ativan, UDS and contract today. RTC already scheduled for 10-2017  Today, I spent more than   25  min with the patient: >50% of the time counseling regards the need for conservative treatment only (at this point);  also pros and cons of self versus formal PT.  Multiple questions answered to the best of my ability.

## 2017-10-11 NOTE — Progress Notes (Signed)
Pre visit review using our clinic review tool, if applicable. No additional management support is needed unless otherwise documented below in the visit note. 

## 2017-10-11 NOTE — Assessment & Plan Note (Signed)
Lumbalgia: Has on and off mild sxs with episodic more intense exacerbations, no more often than once a year.  Patient is concerned, wonders about a MRI, epidural injections etc.  (Both of her sisters require such interventions). Recommend patient conservative treatment, no red flag symptoms.  Extensive discussion about  self PT vs  formal PT. We agreed on use Tylenol, Flexeril, occasional naproxen, self physical therapy.  Call if not improving.  See AVS. Insomnia: On Ativan, UDS and contract today. RTC already scheduled for 10-2017

## 2017-10-13 LAB — PAIN MGMT, PROFILE 8 W/CONF, U
6 ACETYLMORPHINE: NEGATIVE ng/mL (ref <10)
ALCOHOL METABOLITES: POSITIVE ng/mL — AB (ref <500)
AMPHETAMINES: NEGATIVE ng/mL (ref <500)
BENZODIAZEPINES: NEGATIVE ng/mL (ref <100)
Buprenorphine, Urine: NEGATIVE ng/mL (ref <5)
COCAINE METABOLITE: NEGATIVE ng/mL (ref <150)
Creatinine: 30.7 mg/dL
ETHYL GLUCURONIDE (ETG): 2900 ng/mL — AB (ref <500)
Ethyl Sulfate (ETS): 515 ng/mL — ABNORMAL HIGH (ref <100)
MARIJUANA METABOLITE: NEGATIVE ng/mL (ref <20)
MDMA: NEGATIVE ng/mL (ref <500)
OPIATES: NEGATIVE ng/mL (ref <100)
Oxidant: NEGATIVE ug/mL (ref <200)
Oxycodone: NEGATIVE ng/mL (ref <100)
PH: 6.02 (ref 4.5–9.0)

## 2017-11-04 ENCOUNTER — Other Ambulatory Visit: Payer: Self-pay | Admitting: Internal Medicine

## 2017-11-07 ENCOUNTER — Encounter: Payer: BLUE CROSS/BLUE SHIELD | Admitting: Internal Medicine

## 2017-11-12 ENCOUNTER — Encounter: Payer: BLUE CROSS/BLUE SHIELD | Admitting: Internal Medicine

## 2017-11-12 ENCOUNTER — Other Ambulatory Visit: Payer: Self-pay | Admitting: Internal Medicine

## 2017-11-12 DIAGNOSIS — Z01419 Encounter for gynecological examination (general) (routine) without abnormal findings: Secondary | ICD-10-CM | POA: Diagnosis not present

## 2017-11-12 DIAGNOSIS — Z6834 Body mass index (BMI) 34.0-34.9, adult: Secondary | ICD-10-CM | POA: Diagnosis not present

## 2017-11-12 DIAGNOSIS — Z124 Encounter for screening for malignant neoplasm of cervix: Secondary | ICD-10-CM | POA: Diagnosis not present

## 2017-11-12 DIAGNOSIS — Z1231 Encounter for screening mammogram for malignant neoplasm of breast: Secondary | ICD-10-CM | POA: Diagnosis not present

## 2017-11-14 LAB — HM PAP SMEAR

## 2017-11-21 ENCOUNTER — Encounter: Payer: Self-pay | Admitting: Internal Medicine

## 2017-11-21 ENCOUNTER — Ambulatory Visit (INDEPENDENT_AMBULATORY_CARE_PROVIDER_SITE_OTHER): Payer: BLUE CROSS/BLUE SHIELD | Admitting: Internal Medicine

## 2017-11-21 VITALS — BP 116/74 | HR 58 | Temp 98.1°F | Resp 14 | Ht 64.0 in | Wt 180.4 lb

## 2017-11-21 DIAGNOSIS — Z0000 Encounter for general adult medical examination without abnormal findings: Secondary | ICD-10-CM | POA: Diagnosis not present

## 2017-11-21 DIAGNOSIS — Z800 Family history of malignant neoplasm of digestive organs: Secondary | ICD-10-CM | POA: Diagnosis not present

## 2017-11-21 DIAGNOSIS — Z1211 Encounter for screening for malignant neoplasm of colon: Secondary | ICD-10-CM

## 2017-11-21 LAB — CBC WITH DIFFERENTIAL/PLATELET
BASOS ABS: 0 10*3/uL (ref 0.0–0.1)
BASOS PCT: 0.5 % (ref 0.0–3.0)
Eosinophils Absolute: 0.1 10*3/uL (ref 0.0–0.7)
Eosinophils Relative: 0.8 % (ref 0.0–5.0)
HEMATOCRIT: 43.5 % (ref 36.0–46.0)
Hemoglobin: 14.5 g/dL (ref 12.0–15.0)
LYMPHS PCT: 22.4 % (ref 12.0–46.0)
Lymphs Abs: 1.8 10*3/uL (ref 0.7–4.0)
MCHC: 33.3 g/dL (ref 30.0–36.0)
MCV: 90 fl (ref 78.0–100.0)
MONOS PCT: 8.9 % (ref 3.0–12.0)
Monocytes Absolute: 0.7 10*3/uL (ref 0.1–1.0)
NEUTROS ABS: 5.5 10*3/uL (ref 1.4–7.7)
Neutrophils Relative %: 67.4 % (ref 43.0–77.0)
PLATELETS: 293 10*3/uL (ref 150.0–400.0)
RBC: 4.83 Mil/uL (ref 3.87–5.11)
RDW: 13.5 % (ref 11.5–15.5)
WBC: 8.2 10*3/uL (ref 4.0–10.5)

## 2017-11-21 LAB — COMPREHENSIVE METABOLIC PANEL
ALK PHOS: 43 U/L (ref 39–117)
ALT: 16 U/L (ref 0–35)
AST: 22 U/L (ref 0–37)
Albumin: 4 g/dL (ref 3.5–5.2)
BILIRUBIN TOTAL: 0.6 mg/dL (ref 0.2–1.2)
BUN: 15 mg/dL (ref 6–23)
CALCIUM: 9.3 mg/dL (ref 8.4–10.5)
CO2: 25 mEq/L (ref 19–32)
CREATININE: 0.88 mg/dL (ref 0.40–1.20)
Chloride: 100 mEq/L (ref 96–112)
GFR: 88.64 mL/min (ref >60.00)
Glucose, Bld: 95 mg/dL (ref 70–99)
Potassium: 4.3 mEq/L (ref 3.5–5.1)
Sodium: 135 mEq/L (ref 135–145)
TOTAL PROTEIN: 7.6 g/dL (ref 6.0–8.3)

## 2017-11-21 LAB — TSH: TSH: 1.37 u[IU]/mL (ref 0.35–4.50)

## 2017-11-21 LAB — LIPID PANEL
Cholesterol: 169 mg/dL (ref 0–200)
HDL: 93.6 mg/dL (ref >39.00)
LDL Cholesterol: 57 mg/dL (ref 0–99)
NonHDL: 74.95
TRIGLYCERIDES: 89 mg/dL (ref 0.0–149.0)
Total CHOL/HDL Ratio: 2
VLDL: 17.8 mg/dL (ref 0.0–40.0)

## 2017-11-21 NOTE — Progress Notes (Signed)
Pre visit review using our clinic review tool, if applicable. No additional management support is needed unless otherwise documented below in the visit note. 

## 2017-11-21 NOTE — Assessment & Plan Note (Addendum)
--  Tdap 10-2016 ; Had a flu shot   --Female care: last OV w/ Gyn few days ago, had a  MMG, per pt  --CCS: H/o colon polyps,  3th Cscope 11-08 tics, per GI message  today, next is in 2022 --Labs: CMP, CBC, FLP, TSH. --Diet and exercise discussed, encourage self learning, see AVS.  Also encouraged vitamin D supplements

## 2017-11-21 NOTE — Patient Instructions (Signed)
GO TO THE LAB : Get the blood work     GO TO THE FRONT DESK Schedule your next appointment for a  Physical exam in 1 year    GENERAL INFORMATION ABOUT HEALTHY EATING, CHOLESTEROL, HIGH BLOOD PRESSURE, : The American Heart Association, www.heart.org  Check the "Life's Simple 7" from the [facility] The American diabetes Association www.diabetes.org  "[facility] A to Z Health Guide" book

## 2017-11-21 NOTE — Progress Notes (Signed)
Subjective:    Patient ID: Abigail Mathis, female    DOB: 07-Feb-1971, 47 y.o.   MRN: [id number]  DOS:  11/21/2017 Type of visit - description : CPX Interval history: No major concerns   Review of Systems At the last visit, we had extensive talk about back pain, she is back to baseline with occasional back pain that she manages with naproxen, Tylenol and Flexeril. Had a rash at the chest, is now getting better, she is somewhat concerned about it.  Other than above, a 14 point review of systems is negative    Past Medical History:  Diagnosis Date  . BV (bacterial vaginosis)   . DJD (degenerative joint disease) 07/14/2014  . Endometriosis   . GERD (gastroesophageal reflux disease)   . Hx of colonic polyps    3th Cscope 11-08 tics, repeat in 10 years per patient  . Hypertension   . Rhinitis, allergic     Past Surgical History:  Procedure Laterality Date  . CESAREAN SECTION    . CRYOTHERAPY     Cervix  . EYE SURGERY     prk bilateral  . PELVIC LAPAROSCOPY      Social History   Socioeconomic History  . Marital status: Legally Separated    Spouse name: Not on file  . Number of children: 1  . Years of education: Not on file  . Highest education level: Not on file  Social Needs  . Financial resource strain: Not on file  . Food insecurity - worry: Not on file  . Food insecurity - inability: Not on file  . Transportation needs - medical: Not on file  . Transportation needs - non-medical: Not on file  Occupational History  . Occupation: Child psychotherapist, hospice    Comment: hospice   Tobacco Use  . Smoking status: Passive Smoke Exposure - Never Smoker  . Smokeless tobacco: Never Used  Substance and Sexual Activity  . Alcohol use: Yes    Comment: rarely  . Drug use: Not on file  . Sexual activity: Not on file  Other Topics Concern  . Not on file  Social History Narrative   Son lives w/ her       Family History  Problem Relation Age of Onset  . Hypertension  Mother   . Hypertension Father   . Heart defect Father        pacemaker, CHF  . Diabetes Other        ??  . Cancer Other        colon...GM  . Breast cancer Neg Hx   . CAD Neg Hx        ??     Allergies as of 11/21/2017   No Known Allergies     Medication List        Accurate as of 11/21/17 11:59 PM. Always use your most recent med list.          acetaminophen 500 MG tablet Commonly known as:  TYLENOL Take 500 mg by mouth every 6 (six) hours as needed.   amLODipine 5 MG tablet Commonly known as:  NORVASC Take 1 tablet (5 mg total) by mouth daily.   cyclobenzaprine 5 MG tablet Commonly known as:  FLEXERIL Take 1 tablet (5 mg total) by mouth at bedtime as needed for muscle spasms.   glucosamine-chondroitin 500-400 MG tablet Take 1 tablet by mouth daily.   LORazepam 0.5 MG tablet Commonly known as:  ATIVAN Take 1 tablet (0.5 mg total)  by mouth at bedtime.   multivitamin tablet Take 1 tablet by mouth daily.   ranitidine 75 MG tablet Commonly known as:  ZANTAC Take 1 tablet (75 mg total) by mouth at bedtime.   triamterene-hydrochlorothiazide 37.5-25 MG tablet Commonly known as:  MAXZIDE-25 Take 1 tablet by mouth daily.   VYFEMLA 0.4-35 MG-MCG tablet Generic drug:  norethindrone-ethinyl estradiol TAKE ACTIVE TABLETS BY MOUTH ONCE DAILY CONTINUOUSLY. TAKE PLACEBO ONLY AT THE END OF FOURTH PACKET   ZYRTEC ALLERGY PO Take 1 tablet by mouth as needed.          Objective:   Physical Exam  Skin:      BP 116/74 (BP Location: Left Arm, Patient Position: Sitting, Cuff Size: Normal)   Pulse (!) 58   Temp 98.1 F (36.7 C) (Oral)   Resp 14   Ht 5\' 4"  (1.626 m)   Wt 180 lb 6 oz (81.8 kg)   SpO2 97%   BMI 30.96 kg/m   General:   Well developed, well nourished . NAD.  Neck: No  thyromegaly  HEENT:  Normocephalic . Face symmetric, atraumatic Lungs:  CTA B Normal respiratory effort, no intercostal retractions, no accessory muscle use. Heart: RRR,  no  murmur.  No pretibial edema bilaterally  Abdomen:  Not distended, soft, non-tender. No rebound or rigidity.   Skin: Exposed areas without rash. Not pale. Not jaundice Neurologic:  alert & oriented X3.  Speech normal, gait appropriate for age and unassisted Strength symmetric and appropriate for age.  Psych: Cognition and judgment appear intact.  Cooperative with normal attention span and concentration.  Behavior appropriate. No anxious or depressed appearing.     Assessment & Plan:   Assessment HTN GERD Insomnia on Ativan Allergic rhinitis DJD on flexeril prn Endometriosis  PLAN: HTN: Seems well controlled on amlodipine, Maxide  insomnia: On Ativan, next visit in 1 year, okay to RF as needed DJD, lumbalgia: Sxs are back to baseline with occasional back pain.  She manages with Tylenol, naproxen, Flexeril. RTC 1 years

## 2017-11-22 NOTE — Assessment & Plan Note (Signed)
HTN: Seems well controlled on amlodipine, Maxide  insomnia: On Ativan, next visit in 1 year, okay to RF as needed DJD, lumbalgia: Sxs are back to baseline with occasional back pain.  She manages with Tylenol, naproxen, Flexeril. RTC 1 years

## 2018-01-25 ENCOUNTER — Telehealth: Payer: Self-pay | Admitting: Internal Medicine

## 2018-01-25 NOTE — Telephone Encounter (Signed)
Sent prescription with 1 refill. Moderate UDS, will check a UDS with next refill.

## 2018-01-25 NOTE — Telephone Encounter (Signed)
Pt is requesting refill on lorazepam.  Last OV: 11/21/2017 Last Fill: 05/16/2017 #30 and 5rf UDS: 10/11/2017 Moderate risk  NCCR printed- no discrepancies- sent for scanning.   Please advise.

## 2018-05-05 ENCOUNTER — Telehealth: Payer: Self-pay | Admitting: Internal Medicine

## 2018-05-06 NOTE — Telephone Encounter (Signed)
Sent!

## 2018-05-06 NOTE — Telephone Encounter (Signed)
Pt is requesting refill on lorazepam.   Last OV: 11/21/2017 Last Fill: 01/25/2018 #30 and 1RF UDS: 10/11/2017 Moderate risk   NCCR in media from 01/25/2018- no discrepancies noted  Please advise.

## 2018-06-17 ENCOUNTER — Other Ambulatory Visit: Payer: Self-pay | Admitting: Internal Medicine

## 2018-10-03 DIAGNOSIS — H5213 Myopia, bilateral: Secondary | ICD-10-CM | POA: Diagnosis not present

## 2018-10-03 DIAGNOSIS — H524 Presbyopia: Secondary | ICD-10-CM | POA: Diagnosis not present

## 2018-10-03 DIAGNOSIS — H52203 Unspecified astigmatism, bilateral: Secondary | ICD-10-CM | POA: Diagnosis not present

## 2018-10-28 ENCOUNTER — Other Ambulatory Visit: Payer: Self-pay | Admitting: Internal Medicine

## 2018-11-11 ENCOUNTER — Other Ambulatory Visit: Payer: Self-pay | Admitting: Internal Medicine

## 2018-11-21 DIAGNOSIS — Z3040 Encounter for surveillance of contraceptives, unspecified: Secondary | ICD-10-CM | POA: Diagnosis not present

## 2018-11-21 DIAGNOSIS — Z6834 Body mass index (BMI) 34.0-34.9, adult: Secondary | ICD-10-CM | POA: Diagnosis not present

## 2018-11-21 DIAGNOSIS — B372 Candidiasis of skin and nail: Secondary | ICD-10-CM | POA: Diagnosis not present

## 2018-11-21 DIAGNOSIS — Z1231 Encounter for screening mammogram for malignant neoplasm of breast: Secondary | ICD-10-CM | POA: Diagnosis not present

## 2018-11-21 LAB — HM MAMMOGRAPHY

## 2018-11-28 ENCOUNTER — Encounter: Payer: Self-pay | Admitting: Internal Medicine

## 2018-12-04 ENCOUNTER — Encounter: Payer: Self-pay | Admitting: Internal Medicine

## 2018-12-04 ENCOUNTER — Ambulatory Visit (INDEPENDENT_AMBULATORY_CARE_PROVIDER_SITE_OTHER): Payer: BLUE CROSS/BLUE SHIELD | Admitting: Internal Medicine

## 2018-12-04 VITALS — BP 126/66 | HR 69 | Temp 97.9°F | Resp 16 | Ht 64.0 in | Wt 185.4 lb

## 2018-12-04 DIAGNOSIS — Z0000 Encounter for general adult medical examination without abnormal findings: Secondary | ICD-10-CM | POA: Diagnosis not present

## 2018-12-04 DIAGNOSIS — G4700 Insomnia, unspecified: Secondary | ICD-10-CM

## 2018-12-04 DIAGNOSIS — Z79899 Other long term (current) drug therapy: Secondary | ICD-10-CM | POA: Diagnosis not present

## 2018-12-04 LAB — CBC WITH DIFFERENTIAL/PLATELET
BASOS ABS: 0 10*3/uL (ref 0.0–0.1)
Basophils Relative: 0.6 % (ref 0.0–3.0)
Eosinophils Absolute: 0.1 10*3/uL (ref 0.0–0.7)
Eosinophils Relative: 1.1 % (ref 0.0–5.0)
HCT: 43.6 % (ref 36.0–46.0)
Hemoglobin: 14.4 g/dL (ref 12.0–15.0)
LYMPHS ABS: 1.6 10*3/uL (ref 0.7–4.0)
Lymphocytes Relative: 21.5 % (ref 12.0–46.0)
MCHC: 32.9 g/dL (ref 30.0–36.0)
MCV: 88.7 fl (ref 78.0–100.0)
MONOS PCT: 9.3 % (ref 3.0–12.0)
Monocytes Absolute: 0.7 10*3/uL (ref 0.1–1.0)
NEUTROS ABS: 5 10*3/uL (ref 1.4–7.7)
NEUTROS PCT: 67.5 % (ref 43.0–77.0)
PLATELETS: 267 10*3/uL (ref 150.0–400.0)
RBC: 4.92 Mil/uL (ref 3.87–5.11)
RDW: 13.4 % (ref 11.5–15.5)
WBC: 7.4 10*3/uL (ref 4.0–10.5)

## 2018-12-04 LAB — LIPID PANEL
Cholesterol: 165 mg/dL (ref 0–200)
HDL: 81.4 mg/dL (ref >39.00)
LDL CALC: 66 mg/dL (ref 0–99)
NonHDL: 83.26
TRIGLYCERIDES: 87 mg/dL (ref 0.0–149.0)
Total CHOL/HDL Ratio: 2
VLDL: 17.4 mg/dL (ref 0.0–40.0)

## 2018-12-04 LAB — BASIC METABOLIC PANEL
BUN: 13 mg/dL (ref 6–23)
CALCIUM: 8.8 mg/dL (ref 8.4–10.5)
CO2: 25 mEq/L (ref 19–32)
Chloride: 102 mEq/L (ref 96–112)
Creatinine, Ser: 0.97 mg/dL (ref 0.40–1.20)
GFR: 74.2 mL/min (ref >60.00)
Glucose, Bld: 78 mg/dL (ref 70–99)
Potassium: 4 mEq/L (ref 3.5–5.1)
Sodium: 137 mEq/L (ref 135–145)

## 2018-12-04 MED ORDER — AMLODIPINE BESYLATE 5 MG PO TABS: 5.0000 mg | ORAL_TABLET | Freq: Every day | ORAL | 3 refills | Status: DC

## 2018-12-04 MED ORDER — TRIAMTERENE-HCTZ 37.5-25 MG PO TABS: 1.0000 | ORAL_TABLET | Freq: Every day | ORAL | 3 refills | Status: DC

## 2018-12-04 MED ORDER — FAMOTIDINE 20 MG PO TABS: 20.0000 mg | ORAL_TABLET | Freq: Two times a day (BID) | ORAL | 11 refills | Status: DC

## 2018-12-04 MED ORDER — LORAZEPAM 0.5 MG PO TABS: 0.5000 mg | ORAL_TABLET | Freq: Every day | ORAL | 2 refills | Status: DC

## 2018-12-04 MED ORDER — CYCLOBENZAPRINE HCL 5 MG PO TABS: 5.0000 mg | ORAL_TABLET | Freq: Every evening | ORAL | 5 refills | Status: DC | PRN

## 2018-12-04 NOTE — Progress Notes (Signed)
Pre visit review using our clinic review tool, if applicable. No additional management support is needed unless otherwise documented below in the visit note. 

## 2018-12-04 NOTE — Assessment & Plan Note (Addendum)
--  Tdap 10-2016 ; Had a flu shot   --Female care: per gyn, MMG 10/2018 --CCS: H/o colon polyps,  3th Cscope 11-08 tics, per GI message  today, next is in 2022 --Labs: BMP, CBC, FLP -EKG: Normal sinus rhythm, nonspecific T abnormalities.  No acute changes. --Diet and exercise discussed, she is actually doing well, goes to the gym, does the recumbent bike

## 2018-12-04 NOTE — Assessment & Plan Note (Signed)
HTN: Seems controlled on amlodipine, Maxide.  No change GERD: Ranitidine not available, prescription for famotidine sent. Insomnia: On Ativan, uses as needed, UDS and contract today.RF sent  DJD: Occasional back and knee pain, she is doing well trying to remain active.  Takes glucosamine and occasional Flexeril. RTC 1 year

## 2018-12-04 NOTE — Progress Notes (Signed)
Subjective:    Patient ID: Abigail Mathis, female    DOB: 09-06-1971, 48 y.o.   MRN: [id number]  DOS:  12/04/2018 Type of visit - description: cpx In general feeling well.  Review of Systems Reports a lot of stress but she is managing it well. Having back pain, knee pain, on and off.  She remains active, takes glucosamine. Occasional insomnia still persist  Other than above, a 14 point review of systems is negative    Past Medical History:  Diagnosis Date  . BV (bacterial vaginosis)   . DJD (degenerative joint disease) 07/14/2014  . Endometriosis   . GERD (gastroesophageal reflux disease)   . Hx of colonic polyps    3th Cscope 11-08 tics, repeat in 10 years per patient  . Hypertension   . Rhinitis, allergic     Past Surgical History:  Procedure Laterality Date  . CESAREAN SECTION    . CRYOTHERAPY     Cervix  . EYE SURGERY     prk bilateral  . PELVIC LAPAROSCOPY      Social History   Socioeconomic History  . Marital status: Legally Separated    Spouse name: Not on file  . Number of children: 1  . Years of education: Not on file  . Highest education level: Not on file  Occupational History  . 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 Financial resource strain: Not on file  . Food insecurity:    Worry: Not on file    Inability: Not on file  . Transportation needs:    Medical: Not on file    Non-medical: Not on file  Tobacco Use  . Smoking status: Passive Smoke Exposure - Never Smoker  . Smokeless tobacco: Never Used  Substance and Sexual Activity  . Alcohol use: Yes    Comment: rarely  . Drug use: Not on file  . Sexual activity: Not on file  Lifestyle  . Physical activity:    Days per week: Not on file    Minutes per session: Not on file  . Stress: Not on file  Relationships  . Social connections:    Talks on phone: Not on file    Gets together: Not on file    Attends religious service: Not on file    Active  member of club or organization: Not on file    Attends meetings of clubs or organizations: Not on file    Relationship status: Not on file  . Intimate partner violence:    Fear of current or ex partner: Not on file    Emotionally abused: Not on file    Physically abused: Not on file    Forced sexual activity: Not on file  Other Topics Concern  . Not on file  Social History Narrative   Son lives w/ her       Family History  Problem Relation Age of Onset  . Hypertension Mother   . Hypertension Father   . Heart failure Father        pacemaker, CHF  . Diabetes Other        ??  . Cancer Other        colon...GM  . Breast cancer Neg Hx   . CAD Neg Hx        ??     Allergies as of 12/04/2018   No Known Allergies     Medication List  Accurate as of December 04, 2018  6:18 PM. Always use your most recent med list.        acetaminophen 500 MG tablet Commonly known as:  TYLENOL Take 500 mg by mouth every 6 (six) hours as needed.   amLODipine 5 MG tablet Commonly known as:  NORVASC Take 1 tablet (5 mg total) by mouth daily.   cyclobenzaprine 5 MG tablet Commonly known as:  FLEXERIL Take 1 tablet (5 mg total) by mouth at bedtime as needed for muscle spasms.   famotidine 20 MG tablet Commonly known as:  PEPCID Take 1 tablet (20 mg total) by mouth 2 (two) times daily.   glucosamine-chondroitin 500-400 MG tablet Take 1 tablet by mouth daily.   LORazepam 0.5 MG tablet Commonly known as:  ATIVAN Take 1 tablet (0.5 mg total) by mouth at bedtime.   multivitamin tablet Take 1 tablet by mouth daily.   triamterene-hydrochlorothiazide 37.5-25 MG tablet Commonly known as:  MAXZIDE-25 Take 1 tablet by mouth daily.   VYFEMLA 0.4-35 MG-MCG tablet Generic drug:  norethindrone-ethinyl estradiol TAKE ACTIVE TABLETS BY MOUTH ONCE DAILY CONTINUOUSLY. TAKE PLACEBO ONLY AT THE END OF FOURTH PACKET   ZYRTEC ALLERGY PO Take 1 tablet by mouth as needed.            Objective:   Physical Exam BP 126/66 (BP Location: Left Arm, Patient Position: Sitting, Cuff Size: Small)   Pulse 69   Temp 97.9 F (36.6 C) (Oral)   Resp 16   Ht 5\' 4"  (1.626 m)   Wt 185 lb 6 oz (84.1 kg)   SpO2 98%   BMI 31.82 kg/m  General: Well developed, NAD, BMI noted Neck: No  thyromegaly  HEENT:  Normocephalic . Face symmetric, atraumatic Lungs:  CTA B Normal respiratory effort, no intercostal retractions, no accessory muscle use. Heart: RRR,  no murmur.  No pretibial edema bilaterally  Abdomen:  Not distended, soft, non-tender. No rebound or rigidity.   Skin: Exposed areas without rash. Not pale. Not jaundice Neurologic:  alert & oriented X3.  Speech normal, gait appropriate for age and unassisted Strength symmetric and appropriate for age.  Psych: Cognition and judgment appear intact.  Cooperative with normal attention span and concentration.  Behavior appropriate. No anxious or depressed appearing.     Assessment     Assessment HTN GERD Insomnia on Ativan Allergic rhinitis DJD on flexeril prn Endometriosis  PLAN: HTN: Seems controlled on amlodipine, Maxide.  No change GERD: Ranitidine not available, prescription for famotidine sent. Insomnia: On Ativan, uses as needed, UDS and contract today.RF sent  DJD: Occasional back and knee pain, she is doing well trying to remain active.  Takes glucosamine and occasional Flexeril. RTC 1 year

## 2018-12-04 NOTE — Patient Instructions (Signed)
GO TO THE LAB : Get the blood work     GO TO THE FRONT DESK Schedule your next appointment for a physical exam in 1 year   Check the  blood pressure  monthly   Be sure your blood pressure is between 110/65 and  135/85. If it is consistently higher or lower, let me know    

## 2018-12-09 LAB — PAIN MGMT, PROFILE 8 W/CONF, U
6 Acetylmorphine: NEGATIVE ng/mL (ref <10)
Alcohol Metabolites: POSITIVE ng/mL — AB (ref <500)
Amphetamines: NEGATIVE ng/mL (ref <500)
Benzodiazepines: NEGATIVE ng/mL (ref <100)
Buprenorphine, Urine: NEGATIVE ng/mL (ref <5)
COCAINE METABOLITE: NEGATIVE ng/mL (ref <150)
Creatinine: 51.7 mg/dL
Ethyl Glucuronide (ETG): 5461 ng/mL — ABNORMAL HIGH (ref <500)
Ethyl Sulfate (ETS): 1058 ng/mL — ABNORMAL HIGH (ref <100)
MARIJUANA METABOLITE: NEGATIVE ng/mL (ref <20)
MDMA: NEGATIVE ng/mL (ref <500)
Opiates: NEGATIVE ng/mL (ref <100)
Oxidant: NEGATIVE ug/mL (ref <200)
Oxycodone: NEGATIVE ng/mL (ref <100)
pH: 6.58 (ref 4.5–9.0)

## 2019-01-22 ENCOUNTER — Other Ambulatory Visit: Payer: Self-pay | Admitting: Internal Medicine

## 2019-04-25 ENCOUNTER — Other Ambulatory Visit: Payer: Self-pay | Admitting: Internal Medicine

## 2019-05-15 ENCOUNTER — Other Ambulatory Visit: Payer: Self-pay | Admitting: Internal Medicine

## 2019-05-15 NOTE — Telephone Encounter (Signed)
No need for an appointment until her next physical on 12-2019.  Refill sent.

## 2019-05-15 NOTE — Telephone Encounter (Signed)
Patient asking for a call if she is in need for an appointment before refills. Please advise.

## 2019-05-15 NOTE — Telephone Encounter (Signed)
Lorazepam refill.   Last OV: 12/04/2018 Last Fill: 12/04/2018 #30 and 2RF UDS; 12/04/2018 Low risk

## 2019-05-26 DIAGNOSIS — H5210 Myopia, unspecified eye: Secondary | ICD-10-CM | POA: Diagnosis not present

## 2019-08-04 ENCOUNTER — Ambulatory Visit (INDEPENDENT_AMBULATORY_CARE_PROVIDER_SITE_OTHER): Payer: BC Managed Care – PPO | Admitting: Internal Medicine

## 2019-08-04 ENCOUNTER — Other Ambulatory Visit: Payer: Self-pay

## 2019-08-04 VITALS — BP 146/88 | Temp 97.5°F

## 2019-08-04 DIAGNOSIS — L299 Pruritus, unspecified: Secondary | ICD-10-CM

## 2019-08-04 DIAGNOSIS — I10 Essential (primary) hypertension: Secondary | ICD-10-CM

## 2019-08-04 MED ORDER — HYDROCORTISONE 2.5 % EX CREA: TOPICAL_CREAM | Freq: Two times a day (BID) | CUTANEOUS | 0 refills | Status: AC

## 2019-08-04 NOTE — Progress Notes (Signed)
Subjective:    Patient ID: Abigail Mathis, female    DOB: 1971-06-19, 48 y.o.   MRN: [id number]  DOS:  08/04/2019 Type of visit - description: Virtual Visit via Video Note  I connected with@   by a video enabled telemedicine application and verified that I am speaking with the correct person using two identifiers.   THIS ENCOUNTER IS A VIRTUAL VISIT DUE TO COVID-19 - PATIENT WAS NOT SEEN IN THE OFFICE. PATIENT HAS CONSENTED TO VIRTUAL VISIT / TELEMEDICINE VISIT   Location of patient: home  Location of provider: office  I discussed the limitations of evaluation and management by telemedicine and the availability of in person appointments. The patient expressed understanding and agreed to proceed.  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 Shortly after she started to itch all over, particularly at night. At some point saw circular, small, red dots at the inner areas of the leg and near her feet. Those areas are better. She is quite concerned about the scabies and disturbed by the whole situation "I wonder if it is psychological"    BP Readings from Last 3 Encounters:  08/04/19 (!) 146/88  12/04/18 126/66  11/21/17 116/74     Review of Systems Denies fever chills No exposure to poison ivy or poison oak No rash between the fingers or at the wrists or face.   Past Medical History:  Diagnosis Date  . BV (bacterial vaginosis)   . DJD (degenerative joint disease) 07/14/2014  . Endometriosis   . GERD (gastroesophageal reflux disease)   . Hx of colonic polyps    3th Cscope 11-08 tics, repeat in 10 years per patient  . Hypertension   . Rhinitis, allergic     Past Surgical History:  Procedure Laterality Date  . CESAREAN SECTION    . CRYOTHERAPY     Cervix  . EYE SURGERY     prk bilateral  . PELVIC  LAPAROSCOPY      Social History   Socioeconomic History  . Marital status: Legally Separated    Spouse name: Not on file  . Number of children: 1  . Years of education: Not on file  . Highest education level: Not on file  Occupational History  . Occupation: Child psychotherapist, hospice    Comment: hospice   Social Needs  . Financial resource strain: Not on file  . Food insecurity    Worry: Not on file    Inability: Not on file  . Transportation needs    Medical: Not on file    Non-medical: Not on file  Tobacco Use  . Smoking status: Passive Smoke Exposure - Never Smoker  . Smokeless tobacco: Never Used  Substance and Sexual Activity  . Alcohol use: Yes    Comment: rarely  . Drug use: Not on file  . Sexual activity: Not on file  Lifestyle  . Physical activity    Days per week: Not on file    Minutes per session: Not on file  . Stress: Not on file  Relationships  . Social Musician on phone: Not on file    Gets together: Not on file    Attends religious service: Not on file    Active member of club or organization: Not on file    Attends meetings  of clubs or organizations: Not on file    Relationship status: Not on file  . Intimate partner violence    Fear of current or ex partner: Not on file    Emotionally abused: Not on file    Physically abused: Not on file    Forced sexual activity: Not on file  Other Topics Concern  . Not on file  Social History Narrative   Son lives w/ her        Allergies as of 08/04/2019   No Known Allergies     Medication List       Accurate as of August 04, 2019 11:59 PM. If you have any questions, ask your nurse or doctor.        STOP taking these medications   ZYRTEC ALLERGY PO Stopped by: Kathlene November, MD     TAKE these medications   acetaminophen 500 MG tablet Commonly known as: TYLENOL Take 500 mg by mouth every 6 (six) hours as needed.   amLODipine 5 MG tablet Commonly known as: NORVASC Take 1 tablet (5 mg  total) by mouth daily.   cyclobenzaprine 5 MG tablet Commonly known as: FLEXERIL Take 1 tablet (5 mg total) by mouth at bedtime as needed for muscle spasms.   famotidine 20 MG tablet Commonly known as: PEPCID Take 1 tablet (20 mg total) by mouth 2 (two) times daily.   glucosamine-chondroitin 500-400 MG tablet Take 1 tablet by mouth daily.   hydrocortisone 2.5 % cream Apply topically 2 (two) times daily. Started by: Kathlene November, MD   LORazepam 0.5 MG tablet Commonly known as: ATIVAN TAKE 1 TABLET(0.5 MG) BY MOUTH AT BEDTIME   multivitamin tablet Take 1 tablet by mouth daily.   triamterene-hydrochlorothiazide 37.5-25 MG tablet Commonly known as: MAXZIDE-25 Take 1 tablet by mouth daily.   Vyfemla 0.4-35 MG-MCG tablet Generic drug: norethindrone-ethinyl estradiol TAKE ACTIVE TABLETS BY MOUTH ONCE DAILY CONTINUOUSLY. TAKE PLACEBO ONLY AT THE END OF FOURTH PACKET           Objective:   Physical Exam BP (!) 146/88   Temp (!) 97.5 F (36.4 C)  As reported by the patient This is a virtual video visit, she is alert oriented x3, she seems apprehensive.  She attempted to show me the remainings of the rash on her legs but the quality of the video would not allow me to really see what is going on    Assessment      Assessment HTN GERD Insomnia on Ativan Allergic rhinitis DJD on flexeril prn Endometriosis  PLAN: Pruritus, rash: In the context of recently visiting a patient with bedbugs.  She is concerned about scabies however is getting better, I doubt she has a scabies. Bedbugs are  possible She is quite apprehensive about it.  I tried to reassure her, does not seem to have a serious condition Plan: Apply hydrocortisone 2.5% at the areas where the rash is resolving For insomnia she takes Ativan and wonders if she could take Benadryl and  is okay to take a 25 mg at bedtime and at most an additional Benadryl if she cannot sleep. Call if not gradually better.  Derm referral?  HTN: Typically well controlled, BP was slightly elevated today.  Continue amlodipine, Maxide.     I discussed the assessment and treatment plan with the patient. The patient was provided an opportunity to ask questions and all were answered. The patient agreed with the plan and demonstrated an understanding of the instructions.  The patient was advised to call back or seek an in-person evaluation if the symptoms worsen or if the condition fails to improve as anticipated.

## 2019-08-06 NOTE — Assessment & Plan Note (Signed)
Pruritus, rash: In the context of recently visiting a patient with bedbugs.  She is concerned about scabies however is getting better, I doubt she has a scabies. Bedbugs are  possible She is quite apprehensive about it.  I tried to reassure her, does not seem to have a serious condition Plan: Apply hydrocortisone 2.5% at the areas where the rash is resolving For insomnia she takes Ativan and wonders if she could take Benadryl and  is okay to take a 25 mg at bedtime and at most an additional Benadryl if she cannot sleep. Call if not gradually better.  Derm referral? HTN: Typically well controlled, BP was slightly elevated today.  Continue amlodipine, Maxide.

## 2019-10-06 DIAGNOSIS — H524 Presbyopia: Secondary | ICD-10-CM | POA: Diagnosis not present

## 2019-10-06 DIAGNOSIS — H5213 Myopia, bilateral: Secondary | ICD-10-CM | POA: Diagnosis not present

## 2019-10-06 DIAGNOSIS — H52203 Unspecified astigmatism, bilateral: Secondary | ICD-10-CM | POA: Diagnosis not present

## 2019-12-03 DIAGNOSIS — Z1231 Encounter for screening mammogram for malignant neoplasm of breast: Secondary | ICD-10-CM | POA: Diagnosis not present

## 2019-12-03 DIAGNOSIS — Z3040 Encounter for surveillance of contraceptives, unspecified: Secondary | ICD-10-CM | POA: Diagnosis not present

## 2019-12-03 DIAGNOSIS — Z1239 Encounter for other screening for malignant neoplasm of breast: Secondary | ICD-10-CM | POA: Diagnosis not present

## 2019-12-03 DIAGNOSIS — Z01419 Encounter for gynecological examination (general) (routine) without abnormal findings: Secondary | ICD-10-CM | POA: Diagnosis not present

## 2019-12-03 DIAGNOSIS — L282 Other prurigo: Secondary | ICD-10-CM | POA: Diagnosis not present

## 2019-12-03 LAB — HM MAMMOGRAPHY

## 2019-12-08 ENCOUNTER — Encounter: Payer: Self-pay | Admitting: Internal Medicine

## 2019-12-08 ENCOUNTER — Ambulatory Visit (INDEPENDENT_AMBULATORY_CARE_PROVIDER_SITE_OTHER): Payer: BC Managed Care – PPO | Admitting: Internal Medicine

## 2019-12-08 ENCOUNTER — Other Ambulatory Visit: Payer: Self-pay

## 2019-12-08 VITALS — BP 125/84 | HR 88 | Temp 96.8°F | Resp 16 | Ht 64.0 in | Wt 187.5 lb

## 2019-12-08 DIAGNOSIS — Z79899 Other long term (current) drug therapy: Secondary | ICD-10-CM | POA: Diagnosis not present

## 2019-12-08 DIAGNOSIS — Z0000 Encounter for general adult medical examination without abnormal findings: Secondary | ICD-10-CM | POA: Diagnosis not present

## 2019-12-08 DIAGNOSIS — G4700 Insomnia, unspecified: Secondary | ICD-10-CM | POA: Diagnosis not present

## 2019-12-08 LAB — COMPREHENSIVE METABOLIC PANEL
ALT: 16 U/L (ref 0–35)
AST: 19 U/L (ref 0–37)
Albumin: 3.9 g/dL (ref 3.5–5.2)
Alkaline Phosphatase: 50 U/L (ref 39–117)
BUN: 17 mg/dL (ref 6–23)
CO2: 25 mEq/L (ref 19–32)
Calcium: 9 mg/dL (ref 8.4–10.5)
Chloride: 101 mEq/L (ref 96–112)
Creatinine, Ser: 1.04 mg/dL (ref 0.40–1.20)
GFR: 68.18 mL/min (ref >60.00)
Glucose, Bld: 87 mg/dL (ref 70–99)
Potassium: 3.9 mEq/L (ref 3.5–5.1)
Sodium: 134 mEq/L — ABNORMAL LOW (ref 135–145)
Total Bilirubin: 0.5 mg/dL (ref 0.2–1.2)
Total Protein: 7 g/dL (ref 6.0–8.3)

## 2019-12-08 LAB — CBC WITH DIFFERENTIAL/PLATELET
Basophils Absolute: 0.1 10*3/uL (ref 0.0–0.1)
Basophils Relative: 0.9 % (ref 0.0–3.0)
Eosinophils Absolute: 0.1 10*3/uL (ref 0.0–0.7)
Eosinophils Relative: 1.9 % (ref 0.0–5.0)
HCT: 42.3 % (ref 36.0–46.0)
Hemoglobin: 14.1 g/dL (ref 12.0–15.0)
Lymphocytes Relative: 22.9 % (ref 12.0–46.0)
Lymphs Abs: 1.6 10*3/uL (ref 0.7–4.0)
MCHC: 33.4 g/dL (ref 30.0–36.0)
MCV: 87.1 fl (ref 78.0–100.0)
Monocytes Absolute: 0.7 10*3/uL (ref 0.1–1.0)
Monocytes Relative: 10.1 % (ref 3.0–12.0)
Neutro Abs: 4.4 10*3/uL (ref 1.4–7.7)
Neutrophils Relative %: 64.2 % (ref 43.0–77.0)
Platelets: 273 10*3/uL (ref 150.0–400.0)
RBC: 4.86 Mil/uL (ref 3.87–5.11)
RDW: 13 % (ref 11.5–15.5)
WBC: 6.8 10*3/uL (ref 4.0–10.5)

## 2019-12-08 LAB — LIPID PANEL
Cholesterol: 183 mg/dL (ref 0–200)
HDL: 88.6 mg/dL (ref >39.00)
LDL Cholesterol: 73 mg/dL (ref 0–99)
NonHDL: 94.48
Total CHOL/HDL Ratio: 2
Triglycerides: 106 mg/dL (ref 0.0–149.0)
VLDL: 21.2 mg/dL (ref 0.0–40.0)

## 2019-12-08 LAB — HEMOGLOBIN A1C: Hgb A1c MFr Bld: 5.6 % (ref 4.6–6.5)

## 2019-12-08 MED ORDER — TRIAMTERENE-HCTZ 37.5-25 MG PO TABS: 1.0000 | ORAL_TABLET | Freq: Every day | ORAL | 3 refills | Status: DC

## 2019-12-08 MED ORDER — FAMOTIDINE 20 MG PO TABS: 20.0000 mg | ORAL_TABLET | Freq: Two times a day (BID) | ORAL | 3 refills | Status: DC

## 2019-12-08 MED ORDER — AMLODIPINE BESYLATE 5 MG PO TABS: 5.0000 mg | ORAL_TABLET | Freq: Every day | ORAL | 3 refills | Status: DC

## 2019-12-08 NOTE — Assessment & Plan Note (Signed)
Here for CPX HTN: Reports good ambulatory BPs continue amlodipine and Maxide. Insomnia: Continue Ativan.  UDS Plantar fasciitis: Going on for few months, discussed appropriate stretching twice a day for several weeks, ice, OTCs.  If not getting better she will let me know. Grieving: Lost her father October 2020, doing "okay", counseled RTC 1 year

## 2019-12-08 NOTE — Assessment & Plan Note (Signed)
--  Tdap 10-2016 - Had a flu shot   --Female care: per gyn, had a MMG 10/2018 --CCS: H/o colon polyps,  3th Cscope 11-08 tics, next is in 2022 --Labs: CMP, FLP, CBC, A1c (FH DM).  Also UDS --Diet and exercise

## 2019-12-08 NOTE — Patient Instructions (Signed)
GO TO THE LAB : Get the blood work     GO TO THE FRONT DESK Come back for a physical exam in 1 year, please make an appointment  Continue checking your blood pressures at home BP GOAL is between 110/65 and  135/85. If it is consistently higher or lower, let me know   Plantar fasciitis: Please do the stretch twice a day as recommended Okay to take occasional naproxen or Tylenol Ice twice a day as needed Call if no better

## 2019-12-08 NOTE — Progress Notes (Signed)
Pre visit review using our clinic review tool, if applicable. No additional management support is needed unless otherwise documented below in the visit note. 

## 2019-12-08 NOTE — Progress Notes (Signed)
   Subjective:    Patient ID: Abigail Mathis, female    DOB: [birth year], 49 y.o.   MRN: [id number]  DOS:  12/08/2019 Type of visit - description: CPX In general feeling well. Few months history of right heel pain, at the plantar aspect, very sharp and severe with the first steps, then less intense. Thinks she has plantar fasciitis   Review of Systems  Other than above, a 14 point review of systems is negative     Past Medical History:  Diagnosis Date  . BV (bacterial vaginosis)   . DJD (degenerative joint disease) 07/14/2014  . Endometriosis   . GERD (gastroesophageal reflux disease)   . Hx of colonic polyps    3th Cscope 11-08 tics, repeat in 10 years per patient  . Hypertension   . Rhinitis, allergic     Past Surgical History:  Procedure Laterality Date  . CESAREAN SECTION    . CRYOTHERAPY     Cervix  . EYE SURGERY     prk bilateral  . PELVIC LAPAROSCOPY     Family History  Problem Relation Age of Onset  . Hypertension Mother   . Hypertension Father   . Heart failure Father        pacemaker, CHF  . Diabetes Other        ??  . Cancer Other        colon...GM  . Breast cancer Neg Hx   . CAD Neg Hx        ??        Objective:   Physical Exam BP 125/84 (BP Location: Left Arm, Patient Position: Sitting, Cuff Size: Normal)   Pulse 88   Temp (!) 96.8 F (36 C) (Temporal)   Resp 16   Ht 5\' 4"  (1.626 m)   Wt 187 lb 8 oz (85 kg)   SpO2 100%   BMI 32.18 kg/m  General: Well developed, NAD, BMI noted Neck: No  thyromegaly  HEENT:  Normocephalic . Face symmetric, atraumatic Lungs:  CTA B Normal respiratory effort, no intercostal retractions, no accessory muscle use. Heart: RRR,  no murmur.  No pretibial edema bilaterally  Abdomen:  Not distended, soft, non-tender. No rebound or rigidity.   Skin: Exposed areas without rash. Not pale. Not jaundice MSK: Feet and ankles symmetric.  + TTP at the plantar aspect of the right heel without  swelling. Neurologic:  alert & oriented X3.  Speech normal, gait appropriate for age and unassisted Strength symmetric and appropriate for age.  Psych: Cognition and judgment appear intact.  Cooperative with normal attention span and concentration.  Behavior appropriate. No anxious or depressed appearing.     Assessment    Assessment HTN GERD Insomnia on Ativan Allergic rhinitis DJD on flexeril prn Endometriosis  PLAN: Here for CPX HTN: Reports good ambulatory BPs continue amlodipine and Maxide. Insomnia: Continue Ativan.  UDS Plantar fasciitis: Going on for few months, discussed appropriate stretching twice a day for several weeks, ice, OTCs.  If not getting better she will let me know. Grieving: Lost her father October 2020, doing "okay", counseled RTC 1 year  This visit occurred during the SARS-CoV-2 public health emergency.  Safety protocols were in place, including screening questions prior to the visit, additional usage of staff PPE, and extensive cleaning of exam room while observing appropriate contact time as indicated for disinfecting solutions.

## 2019-12-10 LAB — PAIN MGMT, PROFILE 8 W/CONF, U
6 Acetylmorphine: NEGATIVE ng/mL
Alcohol Metabolites: POSITIVE ng/mL — AB (ref <500)
Alphahydroxyalprazolam: NEGATIVE ng/mL
Alphahydroxymidazolam: NEGATIVE ng/mL
Alphahydroxytriazolam: NEGATIVE ng/mL
Aminoclonazepam: NEGATIVE ng/mL
Amphetamines: NEGATIVE ng/mL
Benzodiazepines: POSITIVE ng/mL
Buprenorphine, Urine: NEGATIVE ng/mL
Cocaine Metabolite: NEGATIVE ng/mL
Creatinine: 84.1 mg/dL
Ethyl Glucuronide (ETG): 13090 ng/mL
Ethyl Sulfate (ETS): 1861 ng/mL
Hydroxyethylflurazepam: NEGATIVE ng/mL
Lorazepam: 155 ng/mL
MDMA: NEGATIVE ng/mL
Marijuana Metabolite: NEGATIVE ng/mL
Nordiazepam: NEGATIVE ng/mL
Opiates: NEGATIVE ng/mL
Oxazepam: NEGATIVE ng/mL
Oxidant: NEGATIVE ug/mL
Oxycodone: NEGATIVE ng/mL
Temazepam: NEGATIVE ng/mL
pH: 6.8 (ref 4.5–9.0)

## 2019-12-11 ENCOUNTER — Telehealth: Payer: Self-pay

## 2019-12-11 MED ORDER — AMLODIPINE BESYLATE 5 MG PO TABS: 5.0000 mg | ORAL_TABLET | Freq: Every day | ORAL | 3 refills | Status: DC

## 2019-12-11 MED ORDER — TRIAMTERENE-HCTZ 37.5-25 MG PO TABS: 1.0000 | ORAL_TABLET | Freq: Every day | ORAL | 3 refills | Status: DC

## 2019-12-11 MED ORDER — FAMOTIDINE 20 MG PO TABS: 20.0000 mg | ORAL_TABLET | Freq: Two times a day (BID) | ORAL | 3 refills | Status: DC

## 2019-12-11 NOTE — Telephone Encounter (Signed)
Rxs resent to [facility].

## 2019-12-11 NOTE — Telephone Encounter (Signed)
Patient called in to see if Dr. Drue Novel can send in a prescription for    triamterene-hydrochlorothiazide (MAXZIDE-25) 37.5-25 MG tablet [[id number]]   &   famotidine (PEPCID) 20 MG tablet [[id number]]   &   amLODipine (NORVASC) 5 MG tablet [[id number]]    Per the patient she talked to the pharmacy and they told her they never got the order from Dr. Drue Novel    Please follow up  With the pt at [phone]

## 2019-12-12 ENCOUNTER — Encounter: Payer: Self-pay | Admitting: Internal Medicine

## 2020-02-04 ENCOUNTER — Telehealth: Payer: Self-pay

## 2020-02-04 MED ORDER — LORAZEPAM 0.5 MG PO TABS: 0.5000 mg | ORAL_TABLET | Freq: Every day | ORAL | 5 refills | Status: DC

## 2020-02-04 NOTE — Telephone Encounter (Signed)
PDMP okay, RF sent 

## 2020-02-04 NOTE — Telephone Encounter (Signed)
Lorazepam refill.   Last OV: 12/08/19 Last Fill: 05/15/2019 #30 and 5RF Pt sig: 1 tab qhs UDS: 12/08/2019 Low risk

## 2020-07-15 DIAGNOSIS — N898 Other specified noninflammatory disorders of vagina: Secondary | ICD-10-CM | POA: Diagnosis not present

## 2020-09-15 ENCOUNTER — Telehealth: Payer: Self-pay | Admitting: Internal Medicine

## 2020-09-15 NOTE — Telephone Encounter (Signed)
PDMP okay, next CPX is 11-2020, okay to refill.

## 2020-09-15 NOTE — Telephone Encounter (Signed)
Requesting: lorazepam 0.5mg  Contract: 12/04/2018 UDS: 12/08/2019 Last Visit: 12/08/2019 Next Visit: None scheduled Last Refill: 02/04/2020 #30 and 5RF Pt sig: 1 tab qhs  Please Advise

## 2020-10-14 DIAGNOSIS — H524 Presbyopia: Secondary | ICD-10-CM | POA: Diagnosis not present

## 2020-10-14 DIAGNOSIS — H52203 Unspecified astigmatism, bilateral: Secondary | ICD-10-CM | POA: Diagnosis not present

## 2020-10-14 DIAGNOSIS — H5213 Myopia, bilateral: Secondary | ICD-10-CM | POA: Diagnosis not present

## 2020-10-19 DIAGNOSIS — Z20828 Contact with and (suspected) exposure to other viral communicable diseases: Secondary | ICD-10-CM | POA: Diagnosis not present

## 2020-11-10 DIAGNOSIS — N390 Urinary tract infection, site not specified: Secondary | ICD-10-CM | POA: Diagnosis not present

## 2020-11-10 DIAGNOSIS — Z9189 Other specified personal risk factors, not elsewhere classified: Secondary | ICD-10-CM | POA: Diagnosis not present

## 2020-12-08 DIAGNOSIS — Z3040 Encounter for surveillance of contraceptives, unspecified: Secondary | ICD-10-CM | POA: Diagnosis not present

## 2020-12-08 DIAGNOSIS — Z01419 Encounter for gynecological examination (general) (routine) without abnormal findings: Secondary | ICD-10-CM | POA: Diagnosis not present

## 2020-12-08 DIAGNOSIS — Z1211 Encounter for screening for malignant neoplasm of colon: Secondary | ICD-10-CM | POA: Diagnosis not present

## 2020-12-08 DIAGNOSIS — Z1231 Encounter for screening mammogram for malignant neoplasm of breast: Secondary | ICD-10-CM | POA: Diagnosis not present

## 2020-12-08 DIAGNOSIS — Z124 Encounter for screening for malignant neoplasm of cervix: Secondary | ICD-10-CM | POA: Diagnosis not present

## 2020-12-08 LAB — HM MAMMOGRAPHY

## 2020-12-13 ENCOUNTER — Encounter: Payer: Self-pay | Admitting: Internal Medicine

## 2020-12-13 ENCOUNTER — Other Ambulatory Visit: Payer: Self-pay

## 2020-12-13 ENCOUNTER — Ambulatory Visit (INDEPENDENT_AMBULATORY_CARE_PROVIDER_SITE_OTHER): Payer: BC Managed Care – PPO | Admitting: Internal Medicine

## 2020-12-13 VITALS — BP 125/85 | HR 72 | Temp 97.6°F | Ht 64.0 in | Wt 192.0 lb

## 2020-12-13 DIAGNOSIS — Z0000 Encounter for general adult medical examination without abnormal findings: Secondary | ICD-10-CM | POA: Diagnosis not present

## 2020-12-13 DIAGNOSIS — I10 Essential (primary) hypertension: Secondary | ICD-10-CM | POA: Diagnosis not present

## 2020-12-13 DIAGNOSIS — Z1159 Encounter for screening for other viral diseases: Secondary | ICD-10-CM | POA: Diagnosis not present

## 2020-12-13 MED ORDER — CYCLOBENZAPRINE HCL 5 MG PO TABS: 5.0000 mg | ORAL_TABLET | Freq: Every evening | ORAL | 3 refills | Status: AC | PRN

## 2020-12-13 MED ORDER — LORAZEPAM 0.5 MG PO TABS: ORAL_TABLET | ORAL | 1 refills | Status: DC

## 2020-12-13 NOTE — Progress Notes (Signed)
Subjective:    Patient ID: Abigail Mathis, female    DOB: 05-29-1971, 50 y.o.   MRN: [id number]  DOS:  12/13/2020 Type of visit - description: CPX  Since the last office visit, was under a lot of stress, is helping her mother who has a number of medical issues.  She is actually moving from the area to be closer to her. She found a job close to her mother's house and the stress has decreased.  Review of Systems  Other than above, a 14 point review of systems is negative    Past Medical History:  Diagnosis Date  . BV (bacterial vaginosis)   . DJD (degenerative joint disease) 07/14/2014  . Endometriosis   . GERD (gastroesophageal reflux disease)   . Hx of colonic polyps    3th Cscope 11-08 tics, repeat in 10 years per patient  . Hypertension   . Rhinitis, allergic     Past Surgical History:  Procedure Laterality Date  . CESAREAN SECTION    . CRYOTHERAPY     Cervix  . EYE SURGERY     prk bilateral  . PELVIC LAPAROSCOPY      Allergies as of 12/13/2020   No Known Allergies     Medication List       Accurate as of December 13, 2020 11:59 PM. If you have any questions, ask your nurse or doctor.        acetaminophen 500 MG tablet Commonly known as: TYLENOL Take 500 mg by mouth every 6 (six) hours as needed.   amLODipine 5 MG tablet Commonly known as: NORVASC Take 1 tablet (5 mg total) by mouth daily.   cyclobenzaprine 5 MG tablet Commonly known as: FLEXERIL Take 1 tablet (5 mg total) by mouth at bedtime as needed for muscle spasms.   famotidine 20 MG tablet Commonly known as: PEPCID Take 1 tablet (20 mg total) by mouth 2 (two) times daily.   glucosamine-chondroitin 500-400 MG tablet Take 1 tablet by mouth daily.   LORazepam 0.5 MG tablet Commonly known as: ATIVAN TAKE 1 TABLET(0.5 MG) BY MOUTH AT BEDTIME   multivitamin tablet Take 1 tablet by mouth daily.   triamterene-hydrochlorothiazide 37.5-25 MG tablet Commonly known as: MAXZIDE-25 Take 1 tablet  by mouth daily.   Vyfemla 0.4-35 MG-MCG tablet Generic drug: norethindrone-ethinyl estradiol TAKE ACTIVE TABLETS BY MOUTH ONCE DAILY CONTINUOUSLY. TAKE PLACEBO ONLY AT THE END OF FOURTH PACKET          Objective:   Physical Exam BP 125/85 (BP Location: Right Arm, Patient Position: Sitting, Cuff Size: Large)   Pulse 72   Temp 97.6 F (36.4 C) (Oral)   Ht 5\' 4"  (1.626 m)   Wt 192 lb (87.1 kg)   SpO2 100%   BMI 32.96 kg/m  General: Well developed, NAD, BMI noted Neck: No  thyromegaly  HEENT:  Normocephalic . Face symmetric, atraumatic Lungs:  CTA B Normal respiratory effort, no intercostal retractions, no accessory muscle use. Heart: RRR,  no murmur.  Abdomen:  Not distended, soft, non-tender. No rebound or rigidity.   Lower extremities: no pretibial edema bilaterally  Skin: Exposed areas without rash. Not pale. Not jaundice Neurologic:  alert & oriented X3.  Speech normal, gait appropriate for age and unassisted Strength symmetric and appropriate for age.  Psych: Cognition and judgment appear intact.  Cooperative with normal attention span and concentration.  Behavior appropriate. No anxious or depressed appearing.     Assessment      Assessment  HTN GERD Insomnia on Ativan Allergic rhinitis DJD on flexeril prn Endometriosis  PLAN: Here for CPX HTN: Currently on amlodipine, Maxide, very rarely see a trace of edema.  BP today is very good, no change, labs, monitor BPs at home. GERD: Well-controlled most of the time Insomnia: RF Ativan. DJD: Rx Flexeril to be used as needed Social: Moving close to [facility] to stay with her mother, plans to get a physician over there but I will be happy to continue prescribing her medications for a few more months. RTC 1 year    This visit occurred during the SARS-CoV-2 public health emergency.  Safety protocols were in place, including screening questions prior to the visit, additional usage of staff PPE,  and extensive cleaning of exam room while observing appropriate contact time as indicated for disinfecting solutions.

## 2020-12-13 NOTE — Patient Instructions (Signed)
Your next colonoscopy is due November 2022   Check the  blood pressure 2 or 3 times a month  BP GOAL is between 110/65 and  135/85. If it is consistently higher or lower, let me know    GO TO THE LAB : Get the blood work     GO TO THE FRONT DESK, PLEASE SCHEDULE YOUR APPOINTMENTS Come back for a   physical exam in 1 year   VITAMIN  D aprox 1000 units a day

## 2020-12-14 ENCOUNTER — Encounter: Payer: Self-pay | Admitting: Internal Medicine

## 2020-12-14 LAB — CBC WITH DIFFERENTIAL/PLATELET
Basophils Absolute: 0.1 10*3/uL (ref 0.0–0.1)
Basophils Relative: 0.8 % (ref 0.0–3.0)
Eosinophils Absolute: 0.1 10*3/uL (ref 0.0–0.7)
Eosinophils Relative: 1 % (ref 0.0–5.0)
HCT: 42.7 % (ref 36.0–46.0)
Hemoglobin: 14.3 g/dL (ref 12.0–15.0)
Lymphocytes Relative: 22.5 % (ref 12.0–46.0)
Lymphs Abs: 1.9 10*3/uL (ref 0.7–4.0)
MCHC: 33.4 g/dL (ref 30.0–36.0)
MCV: 88.1 fl (ref 78.0–100.0)
Monocytes Absolute: 0.8 10*3/uL (ref 0.1–1.0)
Monocytes Relative: 9.1 % (ref 3.0–12.0)
Neutro Abs: 5.7 10*3/uL (ref 1.4–7.7)
Neutrophils Relative %: 66.6 % (ref 43.0–77.0)
Platelets: 275 10*3/uL (ref 150.0–400.0)
RBC: 4.85 Mil/uL (ref 3.87–5.11)
RDW: 13.1 % (ref 11.5–15.5)
WBC: 8.6 10*3/uL (ref 4.0–10.5)

## 2020-12-14 LAB — COMPREHENSIVE METABOLIC PANEL
ALT: 13 U/L (ref 0–35)
AST: 17 U/L (ref 0–37)
Albumin: 3.9 g/dL (ref 3.5–5.2)
Alkaline Phosphatase: 48 U/L (ref 39–117)
BUN: 11 mg/dL (ref 6–23)
CO2: 27 mEq/L (ref 19–32)
Calcium: 9.4 mg/dL (ref 8.4–10.5)
Chloride: 99 mEq/L (ref 96–112)
Creatinine, Ser: 0.93 mg/dL (ref 0.40–1.20)
GFR: 71.97 mL/min (ref >60.00)
Glucose, Bld: 82 mg/dL (ref 70–99)
Potassium: 3.9 mEq/L (ref 3.5–5.1)
Sodium: 134 mEq/L — ABNORMAL LOW (ref 135–145)
Total Bilirubin: 0.6 mg/dL (ref 0.2–1.2)
Total Protein: 7.2 g/dL (ref 6.0–8.3)

## 2020-12-14 LAB — LIPID PANEL
Cholesterol: 176 mg/dL (ref 0–200)
HDL: 93.5 mg/dL (ref >39.00)
LDL Cholesterol: 66 mg/dL (ref 0–99)
NonHDL: 82.22
Total CHOL/HDL Ratio: 2
Triglycerides: 81 mg/dL (ref 0.0–149.0)
VLDL: 16.2 mg/dL (ref 0.0–40.0)

## 2020-12-14 LAB — HEPATITIS C ANTIBODY
Hepatitis C Ab: NONREACTIVE
SIGNAL TO CUT-OFF: 0 (ref <1.00)

## 2020-12-14 LAB — TSH: TSH: 1.38 u[IU]/mL (ref 0.35–4.50)

## 2020-12-14 NOTE — Assessment & Plan Note (Signed)
-  Tdap 10-2016 -COVID VAX x2 , booster schedule for next week - Had a flu shot  --Female care:per gyn, had a MMG 12/03/19 ([facility]) and just saw gyn, had a MMG there  --CCS: H/o colon polyps, 3th Cscope 11-08 tics, next is due 08-2021, patient is aware --Labs: CMP, FLP, CBC, TSH, hep C --Diet and exercise  : Doing well, trying to exercise regularly, watchs her diet most of the time.

## 2020-12-14 NOTE — Assessment & Plan Note (Signed)
Here for CPX HTN: Currently on amlodipine, Maxide, very rarely see a trace of edema.  BP today is very good, no change, labs, monitor BPs at home. GERD: Well-controlled most of the time Insomnia: RF Ativan. DJD: Rx Flexeril to be used as needed Social: Moving close to [facility] to stay with her mother, plans to get a physician over there but I will be happy to continue prescribing her medications for a few more months. RTC 1 year

## 2021-01-26 ENCOUNTER — Other Ambulatory Visit: Payer: Self-pay

## 2021-01-26 ENCOUNTER — Telehealth: Payer: Self-pay

## 2021-01-26 MED ORDER — TRIAMTERENE-HCTZ 37.5-25 MG PO TABS: 1.0000 | ORAL_TABLET | Freq: Every day | ORAL | 3 refills | Status: AC

## 2021-01-26 MED ORDER — AMLODIPINE BESYLATE 5 MG PO TABS: 5.0000 mg | ORAL_TABLET | Freq: Every day | ORAL | 3 refills | Status: DC

## 2021-01-26 MED ORDER — LORAZEPAM 0.5 MG PO TABS: ORAL_TABLET | ORAL | 1 refills | Status: DC

## 2021-01-26 MED ORDER — FAMOTIDINE 20 MG PO TABS: 20.0000 mg | ORAL_TABLET | Freq: Two times a day (BID) | ORAL | 3 refills | Status: AC

## 2021-01-26 NOTE — Telephone Encounter (Signed)
Pt called needing her prescriptions sent to her new pharmacy  ([facility] [place], [place]).  Dr. Drue Novel- can you send in the lorazepam? [facility] in [place] unable to transfer to new pharmacy d/t it being controlled.

## 2021-01-26 NOTE — Telephone Encounter (Signed)
Rx sent 

## 2021-05-19 ENCOUNTER — Telehealth: Payer: Self-pay | Admitting: Internal Medicine

## 2021-05-19 MED ORDER — AMLODIPINE BESYLATE 5 MG PO TABS: 5.0000 mg | ORAL_TABLET | Freq: Every day | ORAL | 0 refills | Status: AC

## 2021-05-19 MED ORDER — LORAZEPAM 0.5 MG PO TABS: ORAL_TABLET | ORAL | 3 refills | Status: AC

## 2021-05-19 NOTE — Telephone Encounter (Signed)
Medication: acetaminophen (TYLENOL) 500 MG tablet   amLODipine (NORVASC) 5 MG tablet  LORazepam (ATIVAN) 0.5 MG tablet  Has the patient contacted their pharmacy? No. (If no, request that the patient contact the pharmacy for the refill.) (If yes, when and what did the pharmacy advise?)  Preferred Pharmacy (with phone number or street name): [id number] [place], [place] - [address] AT [facility] [address] [place] [facility] [place] [address]  Phone:  [phone]  Fax:  [phone]   Agent: Please be advised that RX refills may take up to 3 business days. We ask that you follow-up with your pharmacy.

## 2021-05-19 NOTE — Telephone Encounter (Signed)
Requesting: lorazepam 0.5mg  Contract: 12/04/2018 UDS: 12/08/2019 Last Visit: 12/13/2020 Next Visit: None Last Refill: 01/26/2021 #30 and 1RF  Please Advise

## 2021-05-19 NOTE — Telephone Encounter (Signed)
PDMP okay, Rx sent 

## 2021-06-14 ENCOUNTER — Encounter: Payer: Self-pay | Admitting: Gastroenterology
